# Patient Record
Sex: Male | Born: 1940
Health system: Southern US, Community
[De-identification: ages and names within clinical notes are randomized; demographics above are authoritative.]

## PROBLEM LIST (undated history)

## (undated) DIAGNOSIS — I82409 Acute embolism and thrombosis of unspecified deep veins of unspecified lower extremity: Secondary | ICD-10-CM

## (undated) DIAGNOSIS — I739 Peripheral vascular disease, unspecified: Secondary | ICD-10-CM

## (undated) DIAGNOSIS — I10 Essential (primary) hypertension: Secondary | ICD-10-CM

## (undated) DIAGNOSIS — D649 Anemia, unspecified: Secondary | ICD-10-CM

## (undated) DIAGNOSIS — K219 Gastro-esophageal reflux disease without esophagitis: Secondary | ICD-10-CM

## (undated) DIAGNOSIS — Z972 Presence of dental prosthetic device (complete) (partial): Secondary | ICD-10-CM

## (undated) DIAGNOSIS — E119 Type 2 diabetes mellitus without complications: Secondary | ICD-10-CM

## (undated) HISTORY — PX: APPENDECTOMY: SHX54

## (undated) HISTORY — PX: EYE SURGERY: SHX253

## (undated) HISTORY — PX: OTHER SURGICAL HISTORY: SHX169

## (undated) HISTORY — PX: CAROTID ARTERY ANGIOPLASTY: SHX1300

---

## 2004-08-24 ENCOUNTER — Ambulatory Visit: Payer: Self-pay | Admitting: *Deleted

## 2005-04-12 ENCOUNTER — Ambulatory Visit: Payer: Self-pay

## 2007-05-14 ENCOUNTER — Ambulatory Visit: Payer: Self-pay | Admitting: Internal Medicine

## 2007-06-10 ENCOUNTER — Ambulatory Visit: Payer: Self-pay | Admitting: Gastroenterology

## 2007-06-14 ENCOUNTER — Ambulatory Visit: Payer: Self-pay | Admitting: Internal Medicine

## 2007-07-15 ENCOUNTER — Ambulatory Visit: Payer: Self-pay | Admitting: Internal Medicine

## 2008-08-19 ENCOUNTER — Ambulatory Visit: Payer: Self-pay | Admitting: Internal Medicine

## 2008-09-13 ENCOUNTER — Ambulatory Visit: Payer: Self-pay | Admitting: Internal Medicine

## 2012-01-29 ENCOUNTER — Ambulatory Visit: Payer: Self-pay | Admitting: Gastroenterology

## 2013-02-25 ENCOUNTER — Ambulatory Visit: Payer: Self-pay | Admitting: Physical Medicine and Rehabilitation

## 2013-07-01 ENCOUNTER — Ambulatory Visit: Payer: Self-pay | Admitting: Vascular Surgery

## 2013-08-14 ENCOUNTER — Ambulatory Visit: Payer: Self-pay | Admitting: Vascular Surgery

## 2013-08-14 LAB — BASIC METABOLIC PANEL
Anion Gap: 4 — ABNORMAL LOW (ref 7–16)
BUN: 9 mg/dL (ref 7–18)
Calcium, Total: 9.6 mg/dL (ref 8.5–10.1)
Chloride: 100 mmol/L (ref 98–107)
Co2: 31 mmol/L (ref 21–32)
Creatinine: 0.84 mg/dL (ref 0.60–1.30)
EGFR (African American): >60
EGFR (Non-African Amer.): >60
Glucose: 111 mg/dL — ABNORMAL HIGH (ref 65–99)
Sodium: 135 mmol/L — ABNORMAL LOW (ref 136–145)

## 2013-08-14 LAB — CBC
HCT: 34.1 % — ABNORMAL LOW (ref 40.0–52.0)
HGB: 11.4 g/dL — ABNORMAL LOW (ref 13.0–18.0)
MCH: 28.2 pg (ref 26.0–34.0)
Platelet: 239 10*3/uL (ref 150–440)
WBC: 6.2 10*3/uL (ref 3.8–10.6)

## 2013-08-22 ENCOUNTER — Inpatient Hospital Stay: Payer: Self-pay | Admitting: Vascular Surgery

## 2013-08-23 LAB — CBC WITH DIFFERENTIAL/PLATELET
Basophil #: 0 10*3/uL (ref 0.0–0.1)
Eosinophil #: 0.1 10*3/uL (ref 0.0–0.7)
Eosinophil %: 1.1 %
HCT: 31.1 % — ABNORMAL LOW (ref 40.0–52.0)
HGB: 10.2 g/dL — ABNORMAL LOW (ref 13.0–18.0)
Lymphocyte #: 1.9 10*3/uL (ref 1.0–3.6)
Lymphocyte %: 23.3 %
MCH: 27.9 pg (ref 26.0–34.0)
MCV: 85 fL (ref 80–100)
Monocyte %: 8.1 %
Neutrophil #: 5.5 10*3/uL (ref 1.4–6.5)
Neutrophil %: 67.2 %
RBC: 3.64 10*6/uL — ABNORMAL LOW (ref 4.40–5.90)
RDW: 14.4 % (ref 11.5–14.5)

## 2013-08-23 LAB — PROTIME-INR: INR: 1.1

## 2013-08-23 LAB — BASIC METABOLIC PANEL
Anion Gap: 4 — ABNORMAL LOW (ref 7–16)
BUN: 9 mg/dL (ref 7–18)
Chloride: 104 mmol/L (ref 98–107)
Creatinine: 0.79 mg/dL (ref 0.60–1.30)
Glucose: 133 mg/dL — ABNORMAL HIGH (ref 65–99)
Osmolality: 278 (ref 275–301)
Potassium: 3.7 mmol/L (ref 3.5–5.1)

## 2014-12-06 ENCOUNTER — Emergency Department: Payer: Self-pay | Admitting: Emergency Medicine

## 2015-03-05 NOTE — Op Note (Signed)
PATIENT NAME:  Victor Dixon, Victor Dixon MR#:  [id number] DATE OF BIRTH:  Sep 28, 1941  DATE OF PROCEDURE:  08/22/2013  PREOPERATIVE DIAGNOSIS: Critical stenosis of the right internal carotid artery.   POSTOPERATIVE DIAGNOSIS: Critical stenosis of the right internal carotid artery.   PROCEDURE PERFORMED: Right carotid endarterectomy with primary arterial closure.   SURGEON: Hortencia Pilar, MD   ANESTHESIA: General by endotracheal intubation.  FIRST ASSISTANT: Ms. Gillie Manners.   FLUIDS: Per anesthesia record.   ESTIMATED BLOOD LOSS: 100 mL.   SPECIMEN: Plaque to pathology for permanent section.   INDICATIONS: Victor Dixon is a 74 year old gentleman, who presented to the office with findings of carotid stenosis. Further workup including CT angiography demonstrated a critical stenosis of the right internal carotid artery. The risks and benefits for carotid endarterectomy were reviewed. Alternative therapies were also reviewed. All questions were answered. The patient has agreed to proceed.   DESCRIPTION OF PROCEDURE: The patient is taken to the operating room and placed in the supine position. After adequate general anesthesia is induced and appropriate invasive monitors are placed, he is positioned supine with his neck flexed and rotated to the left. Right neck and chest wall are prepped and draped in a sterile fashion. Appropriate timeout is called.   A curvilinear incision is created along the anterior margin of the sternocleidomastoid muscle and carried down through the soft tissues. Platysma is transected. The patient has venous abnormalities without a clear-cut external jugular, but more of a large venous plexus with 2 large tributaries coursing medial and diagonally across the operative field. The vein coursing diagonally is then dissected down to identify the jugular vein and subsequently ligated and divided. I believe this represents an aberration of the facial vein as facial vein was not  identified crossing over the bifurcation as is usually seen.   The dissection is then carried down to identify the omohyoid and the common carotid identified at that level as the dissection is then carried in a craniad direction along the anterior margin. The bulb is then identified. It is dissected so that a blue Silastic vessel loop is looped around the superior thyroidal, red Silastic vessel loop around the external and internal carotid artery is dissected to a level above visible plaque formation.   Heparin 7000 units are given. The common carotid followed by the internal followed by the external carotid artery are clamped. Arteriotomy is then made, extended with Potts scissors and an indwelling Sundt shunt is placed without difficulty. Flow is then re-established to the brain.   Endarterectomy is then performed under direct visualization for the common internal and bulb area. External carotid artery is treated with eversion technique. A total of 6 interrupted 7-0 Prolene sutures are used to tack the distal intimal edge within the internal carotid artery and five 6-0 Prolene sutures are used to tack the proximal intimal edge within the common carotid artery. Primary closure is then performed using running 6-0 Prolene beginning in the internal and extending it down to the bulb and then running from the common up to join the suture line. Shunt is removed. Flushing maneuvers are performed and the suture line is completed. Flow is then re-established first to the external carotid artery and then the internal carotid artery to prevent distal embolization. The wound is packed with gauze for several minutes and then inspected for hemostasis. Surgicel and Evicel is placed along the bed of the wound and the wound is then closed using 3-0 Vicryl in a running fashion to re-approximate the  platysma and 4-0 Monocryl subcuticular for the skin. Dermabond is applied. The patient tolerated the procedure well. There were no  immediate complications. He will be awakened in the operating room and then taken to the recovery room.    ____________________________ Katha Cabal, MD ggs:aw D: 08/22/2013 10:05:39 ET T: 08/22/2013 10:26:24 ET JOB#: [id number]  cc: Katha Cabal, MD, <Dictator> Christena Flake. Raechel Ache, MD Katha Cabal MD ELECTRONICALLY SIGNED 08/26/2013 17:55

## 2016-08-14 ENCOUNTER — Other Ambulatory Visit: Payer: Self-pay | Admitting: Vascular Surgery

## 2016-08-14 ENCOUNTER — Other Ambulatory Visit
Admission: RE | Admit: 2016-08-14 | Discharge: 2016-08-14 | Disposition: A | Discharge status: Home / Self Care | Payer: Medicare HMO | Source: Ambulatory Visit | Attending: Vascular Surgery | Admitting: Vascular Surgery

## 2016-08-14 DIAGNOSIS — I739 Peripheral vascular disease, unspecified: Secondary | ICD-10-CM | POA: Insufficient documentation

## 2016-08-14 LAB — CREATININE, SERUM
Creatinine, Ser: 0.82 mg/dL (ref 0.61–1.24)
GFR calc non Af Amer: >60 mL/min (ref >60)

## 2016-08-14 LAB — BUN: BUN: 12 mg/dL (ref 6–20)

## 2016-08-15 ENCOUNTER — Encounter
Admission: RE | Disposition: A | Discharge status: Home / Self Care | Payer: Self-pay | Source: Ambulatory Visit | Attending: Vascular Surgery

## 2016-08-15 ENCOUNTER — Ambulatory Visit
Admission: RE | Admit: 2016-08-15 | Discharge: 2016-08-15 | Disposition: A | Discharge status: Home / Self Care | Payer: Medicare HMO | Source: Ambulatory Visit | Attending: Vascular Surgery | Admitting: Vascular Surgery

## 2016-08-15 DIAGNOSIS — E785 Hyperlipidemia, unspecified: Secondary | ICD-10-CM | POA: Insufficient documentation

## 2016-08-15 DIAGNOSIS — I499 Cardiac arrhythmia, unspecified: Secondary | ICD-10-CM | POA: Diagnosis not present

## 2016-08-15 DIAGNOSIS — Z7982 Long term (current) use of aspirin: Secondary | ICD-10-CM | POA: Diagnosis not present

## 2016-08-15 DIAGNOSIS — Z87891 Personal history of nicotine dependence: Secondary | ICD-10-CM | POA: Diagnosis not present

## 2016-08-15 DIAGNOSIS — I6529 Occlusion and stenosis of unspecified carotid artery: Secondary | ICD-10-CM | POA: Diagnosis not present

## 2016-08-15 DIAGNOSIS — I70223 Atherosclerosis of native arteries of extremities with rest pain, bilateral legs: Secondary | ICD-10-CM | POA: Insufficient documentation

## 2016-08-15 DIAGNOSIS — E119 Type 2 diabetes mellitus without complications: Secondary | ICD-10-CM | POA: Insufficient documentation

## 2016-08-15 DIAGNOSIS — E669 Obesity, unspecified: Secondary | ICD-10-CM | POA: Insufficient documentation

## 2016-08-15 DIAGNOSIS — I10 Essential (primary) hypertension: Secondary | ICD-10-CM | POA: Insufficient documentation

## 2016-08-15 DIAGNOSIS — R0989 Other specified symptoms and signs involving the circulatory and respiratory systems: Secondary | ICD-10-CM | POA: Insufficient documentation

## 2016-08-15 DIAGNOSIS — Z79899 Other long term (current) drug therapy: Secondary | ICD-10-CM | POA: Insufficient documentation

## 2016-08-15 HISTORY — PX: PERIPHERAL VASCULAR CATHETERIZATION: SHX172C

## 2016-08-15 HISTORY — DX: Gastro-esophageal reflux disease without esophagitis: K21.9

## 2016-08-15 HISTORY — DX: Anemia, unspecified: D64.9

## 2016-08-15 HISTORY — DX: Type 2 diabetes mellitus without complications: E11.9

## 2016-08-15 HISTORY — DX: Essential (primary) hypertension: I10

## 2016-08-15 LAB — GLUCOSE, CAPILLARY: Glucose-Capillary: 153 mg/dL — ABNORMAL HIGH (ref 65–99)

## 2016-08-15 SURGERY — LOWER EXTREMITY ANGIOGRAPHY
Anesthesia: Moderate Sedation | Laterality: Left

## 2016-08-15 MED ORDER — HYDRALAZINE HCL 20 MG/ML IJ SOLN: 5.0000 mg | INTRAMUSCULAR | Status: DC | PRN

## 2016-08-15 MED ORDER — FENTANYL CITRATE (PF) 100 MCG/2ML IJ SOLN
INTRAMUSCULAR | Status: DC | PRN
  Administered 2016-08-15 (×3): 50 ug via INTRAVENOUS

## 2016-08-15 MED ORDER — HYDROCHLOROTHIAZIDE 25 MG PO TABS
25.0000 mg | ORAL_TABLET | Freq: Once | ORAL | Status: AC
  Administered 2016-08-15: 25 mg via ORAL
  Filled 2016-08-15: qty 1

## 2016-08-15 MED ORDER — LIDOCAINE HCL (PF) 1 % IJ SOLN
INTRAMUSCULAR | Status: AC
  Filled 2016-08-15: qty 5

## 2016-08-15 MED ORDER — OXYCODONE HCL 5 MG PO TABS: 5.0000 mg | ORAL_TABLET | ORAL | Status: DC | PRN

## 2016-08-15 MED ORDER — LOSARTAN POTASSIUM 50 MG PO TABS
100.0000 mg | ORAL_TABLET | Freq: Once | ORAL | Status: AC
  Administered 2016-08-15: 100 mg via ORAL
  Filled 2016-08-15: qty 2

## 2016-08-15 MED ORDER — FENTANYL CITRATE (PF) 100 MCG/2ML IJ SOLN
INTRAMUSCULAR | Status: AC
  Filled 2016-08-15: qty 2

## 2016-08-15 MED ORDER — METHYLPREDNISOLONE SODIUM SUCC 125 MG IJ SOLR
125.0000 mg | INTRAMUSCULAR | Status: DC | PRN
Start: 2016-08-15 — End: 2016-08-15

## 2016-08-15 MED ORDER — ACETAMINOPHEN 325 MG RE SUPP: 325.0000 mg | RECTAL | Status: DC | PRN

## 2016-08-15 MED ORDER — HEPARIN SODIUM (PORCINE) 1000 UNIT/ML IJ SOLN
INTRAMUSCULAR | Status: AC
  Filled 2016-08-15: qty 1

## 2016-08-15 MED ORDER — LABETALOL HCL 5 MG/ML IV SOLN
INTRAVENOUS | Status: AC
Start: 2016-08-15 — End: 2016-08-15
  Administered 2016-08-15: 10 mg via INTRAVENOUS
  Filled 2016-08-15: qty 4

## 2016-08-15 MED ORDER — MIDAZOLAM HCL 2 MG/2ML IJ SOLN
INTRAMUSCULAR | Status: DC | PRN
  Administered 2016-08-15: 2 mg via INTRAVENOUS
  Administered 2016-08-15 (×2): 1 mg via INTRAVENOUS

## 2016-08-15 MED ORDER — CARVEDILOL 6.25 MG PO TABS
6.2500 mg | ORAL_TABLET | Freq: Once | ORAL | Status: AC
  Administered 2016-08-15: 6.25 mg via ORAL
  Filled 2016-08-15: qty 1

## 2016-08-15 MED ORDER — ONDANSETRON HCL 4 MG/2ML IJ SOLN: 4.0000 mg | Freq: Four times a day (QID) | INTRAMUSCULAR | Status: DC | PRN

## 2016-08-15 MED ORDER — LABETALOL HCL 5 MG/ML IV SOLN
10.0000 mg | INTRAVENOUS | Status: DC | PRN
  Administered 2016-08-15 (×3): 10 mg via INTRAVENOUS

## 2016-08-15 MED ORDER — DOCUSATE SODIUM 100 MG PO CAPS: 100.0000 mg | ORAL_CAPSULE | Freq: Every day | ORAL | Status: DC

## 2016-08-15 MED ORDER — FAMOTIDINE 20 MG PO TABS: 40.0000 mg | ORAL_TABLET | ORAL | Status: DC | PRN

## 2016-08-15 MED ORDER — PANTOPRAZOLE SODIUM 40 MG PO TBEC: 40.0000 mg | DELAYED_RELEASE_TABLET | Freq: Every day | ORAL | Status: DC

## 2016-08-15 MED ORDER — MORPHINE SULFATE (PF) 4 MG/ML IV SOLN: 2.0000 mg | INTRAVENOUS | Status: DC | PRN

## 2016-08-15 MED ORDER — SODIUM CHLORIDE 0.9 % IV SOLN: INTRAVENOUS | Status: DC

## 2016-08-15 MED ORDER — DEXTROSE 5 % IV SOLN
INTRAVENOUS | Status: AC
  Filled 2016-08-15 (×24): qty 1.5

## 2016-08-15 MED ORDER — METOPROLOL TARTRATE 5 MG/5ML IV SOLN: 5.0000 mg | Freq: Four times a day (QID) | INTRAVENOUS | Status: DC

## 2016-08-15 MED ORDER — DEXTROSE 5 % IV SOLN: 1.5000 g | INTRAVENOUS | Status: DC

## 2016-08-15 MED ORDER — HYDROMORPHONE HCL 1 MG/ML IJ SOLN: 1.0000 mg | Freq: Once | INTRAMUSCULAR | Status: DC

## 2016-08-15 MED ORDER — ACETAMINOPHEN 325 MG PO TABS: 325.0000 mg | ORAL_TABLET | ORAL | Status: DC | PRN

## 2016-08-15 MED ORDER — IOPAMIDOL (ISOVUE-300) INJECTION 61%
INTRAVENOUS | Status: DC | PRN
  Administered 2016-08-15: 80 mL via INTRAVENOUS

## 2016-08-15 MED ORDER — ALUM & MAG HYDROXIDE-SIMETH 200-200-20 MG/5ML PO SUSP: 15.0000 mL | ORAL | Status: DC | PRN

## 2016-08-15 MED ORDER — MIDAZOLAM HCL 5 MG/5ML IJ SOLN
INTRAMUSCULAR | Status: AC
  Filled 2016-08-15: qty 5

## 2016-08-15 MED ORDER — LABETALOL HCL 5 MG/ML IV SOLN
INTRAVENOUS | Status: AC
  Filled 2016-08-15: qty 4

## 2016-08-15 SURGICAL SUPPLY — 10 items
CATH PIG 70CM (CATHETERS) ×4 IMPLANT
DEVICE STARCLOSE SE CLOSURE (Vascular Products) ×4 IMPLANT
DEVICE TORQUE (MISCELLANEOUS) ×4 IMPLANT
GLIDEWIRE ANGLED SS 035X260CM (WIRE) ×4 IMPLANT
PACK ANGIOGRAPHY (CUSTOM PROCEDURE TRAY) ×4 IMPLANT
SET INTRO CAPELLA COAXIAL (SET/KITS/TRAYS/PACK) ×4 IMPLANT
SHEATH BRITE TIP 5FRX11 (SHEATH) ×4 IMPLANT
SYR MEDRAD MARK V 150ML (SYRINGE) ×4 IMPLANT
TUBING CONTRAST HIGH PRESS 72 (TUBING) ×4 IMPLANT
WIRE J 3MM .035X145CM (WIRE) ×4 IMPLANT

## 2016-08-15 NOTE — Op Note (Signed)
[facility]  Percutaneous Study/Intervention Procedural Note   Date of Surgery: 08/15/2016,8:57 AM  Surgeon:Schnier, Dolores Lory   Pre-operative Diagnosis: Atherosclerotic occlusive disease bilateral lower extremities with lifestyle limiting claudication and mild rest pain symptoms  Post-operative diagnosis:  Same  Procedure(s) Performed:  1.  Abdominal aortogram  2.  Right lower extremity distal runoff third order catheter placement   Anesthesia: Conscious sedation was administered under my direct supervision. IV Versed plus fentanyl were utilized. Continuous ECG, pulse oximetry and blood pressure was monitored throughout the entire procedure.  Conscious sedation was administered for a total of 30 minutes.  Sheath: 5 French sheath retrograde left common femoral artery  Contrast: 80 cc   Fluoroscopy Time: 2.3 minutes  Indications:  The patient presented to the office for routine follow-up and surveillance of his atherosclerotic occlusive disease. He had increasing complaints with respect to his lower extremities. He voiced lifestyle limitations and described some mild rest pain symptoms. Noninvasive studies demonstrated moderate to severe atherosclerotic occlusive disease. There were some benefits for angiography with possible intervention are reviewed all questions answered patient agrees to proceed  Procedure:  Kaenan Trollingeris a 75 y.o. male who was identified and appropriate procedural time out was performed.  The patient was then placed supine on the table and prepped and draped in the usual sterile fashion.  Ultrasound was used to evaluate the left common femoral artery.  It was patent .  A digital ultrasound image was acquired.  Amicropuncture needle was used to access the left common femoral artery under direct ultrasound guidance and a permanent image wassaved for the record.microwire was then advanced under fluoroscopic guidance followed by micro-sheath.   A 0.035 J wire was advanced without resistance and a 5Fr sheath was placed.    Pigtail catheter was then advanced to the level of T12 and AP projection of the aorta was obtained. Pigtail catheter was then repositioned to above the bifurcation and LAO view of the pelvis was obtained. Stiff angled Glidewire and pigtail catheter was then used across the bifurcation and the catheter was positioned in the distal external iliac artery.  RAO of the right groin was then obtained. Wire was reintroduced and negotiated into the profunda and the catheter was advanced into the profunda. Distal runoff was then performed.  After review of the images the catheter was removed over wire and an LAO view of the groin was obtained. StarClose device was deployed without difficulty but did not achieve hemostasis. Therefore manual pressure was held.  Findings:   Aortogram:  Diffuse atherosclerotic disease but no hemodynamically significant lesions noted. There are bilateral single renal arteries no hemodynamically significant renal artery stenosis. The common iliac arteries are patent bilaterally however, on the right there is a shelflike plaque in the proximal one third with some poststenotic dilatation noted nevertheless it does not appear to be hemodynamically significant. On the left the common iliac is widely patent. Bilateral external iliac arteries are widely patent. The internal iliac arteries appear to be occluded bilaterally  Right Lower Extremity:  Common femoral demonstrates moderate to severe disease. There is a well collateralized deep femoral. The SFA appears to be patent in its proximal 2-3 cm but then occludes. The SFA and popliteal are occluded throughout their course down to below the tibial plateau where a short segment of popliteal is reconstituted and fills an anterior tibial which occludes for short distance. The anterior tibial is reconstituted in its proximal one third and then is patent down to  the ankle  where the true dorsalis pedis appears to be occluded but there is a large collateral which intermittently fills the pedal arch. The tibioperoneal trunk and posterior tibial and peroneal are completely occluded throughout their entire course there nonvisualized throughout their entire course.  Left Lower Extremity:  The common femoral is patent in its proximal one third but then tapers to an occlusion at the level of the profunda femoris. Profunda femoris is heavily diseased. SFA is occluded at its origin.  SUMMARY: This patient has extensive atherosclerotic occlusive disease essentially on the right he is occluded from the origin of the SFA down to the mid anterior tibial. He has single vessel runoff. And this single vessel runoff does not cross the ankle nor fill and intact pedal arch. Given these findings his best option for treatment would be a femoral to anterior tibial bypass with vein. However given the fact that he is experiencing only mild rest pain and short distance claudication I will discuss whether he wishes to proceed with surgery in the office as it might be more prudent to wait until the severity of his symptoms increase i.e. he has truly debilitating rest pain or an ulceration develops.   Disposition: Patient was taken to the recovery room in stable condition having tolerated the procedure well.  Belenda Cruise Schnier 08/15/2016,8:57 AM

## 2016-08-15 NOTE — H&P (Signed)
[facility] History & Physical Update  The patient was interviewed and re-examined.  The patient's previous History and Physical has been reviewed and is unchanged.  There is no change in the plan of care. We plan to proceed with the scheduled procedure.  Hortencia Pilar, MD  08/15/2016, 7:57 AM

## 2016-08-15 NOTE — Progress Notes (Signed)
Patient states that he did not take BP medications this am, BP remains elevated after PRN medication x 3. Dr Delana Meyer notified and ordered to give patient's home medications. Orders placed.

## 2016-08-16 ENCOUNTER — Encounter: Payer: Self-pay | Admitting: Vascular Surgery

## 2016-09-11 ENCOUNTER — Encounter (INDEPENDENT_AMBULATORY_CARE_PROVIDER_SITE_OTHER): Payer: Self-pay

## 2016-09-11 ENCOUNTER — Ambulatory Visit (INDEPENDENT_AMBULATORY_CARE_PROVIDER_SITE_OTHER): Payer: Self-pay | Admitting: Vascular Surgery

## 2016-10-17 ENCOUNTER — Other Ambulatory Visit (INDEPENDENT_AMBULATORY_CARE_PROVIDER_SITE_OTHER): Payer: Self-pay | Admitting: Vascular Surgery

## 2016-10-17 DIAGNOSIS — N184 Chronic kidney disease, stage 4 (severe): Secondary | ICD-10-CM

## 2016-10-17 DIAGNOSIS — N189 Chronic kidney disease, unspecified: Secondary | ICD-10-CM | POA: Insufficient documentation

## 2016-10-23 ENCOUNTER — Encounter (INDEPENDENT_AMBULATORY_CARE_PROVIDER_SITE_OTHER): Payer: Self-pay

## 2016-10-23 ENCOUNTER — Ambulatory Visit (INDEPENDENT_AMBULATORY_CARE_PROVIDER_SITE_OTHER): Payer: Self-pay | Admitting: Vascular Surgery

## 2016-11-10 ENCOUNTER — Other Ambulatory Visit: Payer: Self-pay | Admitting: Neurosurgery

## 2016-11-10 DIAGNOSIS — M419 Scoliosis, unspecified: Secondary | ICD-10-CM

## 2016-11-22 ENCOUNTER — Other Ambulatory Visit: Payer: Medicare HMO

## 2016-11-22 ENCOUNTER — Ambulatory Visit
Admission: RE | Admit: 2016-11-22 | Discharge: 2016-11-22 | Disposition: A | Discharge status: Home / Self Care | Payer: Medicare HMO | Source: Ambulatory Visit | Attending: Neurosurgery | Admitting: Neurosurgery

## 2016-11-22 DIAGNOSIS — M50223 Other cervical disc displacement at C6-C7 level: Secondary | ICD-10-CM | POA: Diagnosis not present

## 2016-11-22 DIAGNOSIS — M5124 Other intervertebral disc displacement, thoracic region: Secondary | ICD-10-CM | POA: Insufficient documentation

## 2016-11-22 DIAGNOSIS — M419 Scoliosis, unspecified: Secondary | ICD-10-CM | POA: Insufficient documentation

## 2016-11-22 DIAGNOSIS — M47816 Spondylosis without myelopathy or radiculopathy, lumbar region: Secondary | ICD-10-CM | POA: Diagnosis not present

## 2016-12-28 ENCOUNTER — Ambulatory Visit (INDEPENDENT_AMBULATORY_CARE_PROVIDER_SITE_OTHER): Payer: Medicare HMO

## 2016-12-28 ENCOUNTER — Ambulatory Visit (INDEPENDENT_AMBULATORY_CARE_PROVIDER_SITE_OTHER): Payer: Medicare HMO | Admitting: Vascular Surgery

## 2016-12-28 ENCOUNTER — Encounter (INDEPENDENT_AMBULATORY_CARE_PROVIDER_SITE_OTHER): Payer: Self-pay | Admitting: Vascular Surgery

## 2016-12-28 ENCOUNTER — Other Ambulatory Visit (INDEPENDENT_AMBULATORY_CARE_PROVIDER_SITE_OTHER): Payer: Self-pay | Admitting: Vascular Surgery

## 2016-12-28 DIAGNOSIS — K219 Gastro-esophageal reflux disease without esophagitis: Secondary | ICD-10-CM | POA: Insufficient documentation

## 2016-12-28 DIAGNOSIS — E782 Mixed hyperlipidemia: Secondary | ICD-10-CM

## 2016-12-28 DIAGNOSIS — E785 Hyperlipidemia, unspecified: Secondary | ICD-10-CM | POA: Insufficient documentation

## 2016-12-28 DIAGNOSIS — Z01818 Encounter for other preprocedural examination: Secondary | ICD-10-CM

## 2016-12-28 DIAGNOSIS — I70213 Atherosclerosis of native arteries of extremities with intermittent claudication, bilateral legs: Secondary | ICD-10-CM

## 2016-12-28 DIAGNOSIS — I10 Essential (primary) hypertension: Secondary | ICD-10-CM

## 2016-12-28 DIAGNOSIS — E119 Type 2 diabetes mellitus without complications: Secondary | ICD-10-CM | POA: Insufficient documentation

## 2016-12-28 DIAGNOSIS — I70219 Atherosclerosis of native arteries of extremities with intermittent claudication, unspecified extremity: Secondary | ICD-10-CM | POA: Insufficient documentation

## 2016-12-28 DIAGNOSIS — E1140 Type 2 diabetes mellitus with diabetic neuropathy, unspecified: Secondary | ICD-10-CM

## 2016-12-28 NOTE — Progress Notes (Signed)
MRN : [id number]  Victor Dixon is a 76 y.o. (1941/01/09) male who presents with chief complaint of  Chief Complaint  Patient presents with  . Re-evaluation    Vein mapping-LE  .  History of Present Illness: The patient returns to the office for followup and review of the noninvasive studies. There have been no interval changes in lower extremity symptoms. No interval shortening of the patient's claudication distance or development of rest pain symptoms. No new ulcers or wounds have occurred since the last visit.  There have been no significant changes to the patient's overall health care.  The patient denies amaurosis fugax or recent TIA symptoms. There are no recent neurological changes noted. The patient denies history of DVT, PE or superficial thrombophlebitis. The patient denies recent episodes of angina or shortness of breath.    Current Meds  Medication Sig  . aspirin EC 81 MG tablet Take 81 mg by mouth daily.  . carvedilol (COREG) 6.25 MG tablet Take 6.25 mg by mouth 2 (two) times daily with a meal.  . cilostazol (PLETAL) 100 MG tablet Take 100 mg by mouth 2 (two) times daily.  . Cyanocobalamin (VITAMIN B 12) 250 MCG LOZG Take 500 mcg by mouth every morning.  . ferrous sulfate 325 (65 FE) MG tablet Take 325 mg by mouth daily with breakfast.  . ferrous sulfate 325 (65 FE) MG tablet Take by mouth.  Marland Kitchen FIFTY50 SAFETY SEAL LANCETS MISC   . finasteride (PROSCAR) 5 MG tablet Take 5 mg by mouth daily.  Marland Kitchen gabapentin (NEURONTIN) 300 MG capsule Take 300 mg by mouth 3 (three) times daily.  Marland Kitchen glimepiride (AMARYL) 2 MG tablet Take 2 mg by mouth daily with breakfast.  . losartan-hydrochlorothiazide (HYZAAR) 100-25 MG tablet Take 1 tablet by mouth daily.  . metFORMIN (GLUCOPHAGE-XR) 500 MG 24 hr tablet Take 500 mg by mouth 2 (two) times daily with a meal.  . pantoprazole (PROTONIX) 40 MG tablet Take 40 mg by mouth daily.  . sennosides-docusate sodium (SENOKOT-S) 8.6-50 MG tablet Take  1 tablet by mouth daily.  . simvastatin (ZOCOR) 40 MG tablet Take 40 mg by mouth daily.    Past Medical History:  Diagnosis Date  . Anemia   . Diabetes mellitus without complication ([place])   . GERD (gastroesophageal reflux disease)   . Hypertension     Past Surgical History:  Procedure Laterality Date  . APPENDECTOMY    . CAROTID ARTERY ANGIOPLASTY Right   . PERIPHERAL VASCULAR CATHETERIZATION Left 08/15/2016   Procedure: Lower Extremity Angiography;  Surgeon: Katha Cabal, MD;  Location: [facility];  Service: Cardiovascular;  Laterality: Left;  . PERIPHERAL VASCULAR CATHETERIZATION  08/15/2016   Procedure: Lower Extremity Intervention;  Surgeon: Katha Cabal, MD;  Location: [facility];  Service: Cardiovascular;;    Social History Social History  Substance Use Topics  . Smoking status: Former Research scientist (life sciences)  . Smokeless tobacco: Never Used  . Alcohol use No    Family History No family history on file. No family history of bleeding/clotting disorders, porphyria or autoimmune disease   No Known Allergies   REVIEW OF SYSTEMS (Negative unless checked)  Constitutional: [] Weight loss  [] Fever  [] Chills Cardiac: [] Chest pain   [] Chest pressure   [] Palpitations   [] Shortness of breath when laying flat   [] Shortness of breath with exertion. Vascular:  [x] Pain in legs with walking   [] Pain in legs at rest  [] History of DVT   [] Phlebitis   [  x]Swelling in legs   [] Varicose veins   [] Non-healing ulcers Pulmonary:   [] Uses home oxygen   [] Productive cough   [] Hemoptysis   [] Wheeze  [] COPD   [] Asthma Neurologic:  [] Dizziness   [] Seizures   [] History of stroke   [] History of TIA  [] Aphasia   [] Vissual changes   [] Weakness or numbness in arm   [] Weakness or numbness in leg Musculoskeletal:   [] Joint swelling   [x] Joint pain   [x] Low back pain Hematologic:  [] Easy bruising  [] Easy bleeding   [] Hypercoagulable state   [] Anemic Gastrointestinal:  [] Diarrhea   [] Vomiting   [] Gastroesophageal reflux/heartburn   [] Difficulty swallowing. Genitourinary:  [] Chronic kidney disease   [] Difficult urination  [] Frequent urination   [] Blood in urine Skin:  [] Rashes   [] Ulcers  Psychological:  [] History of anxiety   []  History of major depression.  Physical Examination  Vitals:   12/28/16 1520  BP: (!) 129/59  Pulse: (!) 102  Resp: 16  Weight: 212 lb (96.2 kg)  Height: 5\' 11"  (1.803 m)   Body mass index is 29.57 kg/m. Gen: WD/WN, NAD Head: /AT, No temporalis wasting.  Ear/Nose/Throat: Hearing grossly intact, nares w/o erythema or drainage, poor dentition Eyes: PER, EOMI, sclera nonicteric.  Neck: Supple, no masses.  No bruit or JVD.  Pulmonary:  Good air movement, clear to auscultation bilaterally, no use of accessory muscles.  Cardiac: RRR, normal S1, S2, no Murmurs. Vascular:  2-3 second cap refill non ulcers feet cool not cold to tough, 2+ pitting edema mild venous stasis changes Vessel Right Left  Radial Palpable Palpable  Ulnar Palpable Palpable  Brachial Palpable Palpable  Carotid Palpable Palpable  Femoral Palpable Palpable  Popliteal Not Palpable Not Palpable  PT Not Palpable Not Palpable  DP Not Palpable Not Palpable   Gastrointestinal: soft, non-distended. No guarding/no peritoneal signs.  Musculoskeletal: M/S 5/5 throughout.  No deformity or atrophy.  Neurologic: CN 2-12 intact. Pain and light touch intact in extremities.  Symmetrical.  Speech is fluent. Motor exam as listed above. Psychiatric: Judgment intact, Mood & affect appropriate for pt's clinical situation. Dermatologic: No rashes or ulcers noted.  No changes consistent with cellulitis. Lymph : No Cervical lymphadenopathy, no lichenification or skin changes of chronic lymphedema.  CBC Lab Results  Component Value Date   WBC 8.2 08/23/2013   HGB 10.2 (L) 08/23/2013   HCT 31.1 (L) 08/23/2013   MCV 85 08/23/2013   PLT 204 08/23/2013    BMET    Component Value Date/Time   NA  139 08/23/2013 0231   K 3.7 08/23/2013 0231   CL 104 08/23/2013 0231   CO2 31 08/23/2013 0231   GLUCOSE 133 (H) 08/23/2013 0231   BUN 12 08/14/2016 1235   BUN 9 08/23/2013 0231   CREATININE 0.82 08/14/2016 1235   CREATININE 0.79 08/23/2013 0231   CALCIUM 8.5 08/23/2013 0231   GFRNONAA >60 08/14/2016 1235   GFRNONAA >60 08/23/2013 0231   GFRAA >60 08/14/2016 1235   GFRAA >60 08/23/2013 0231   CrCl cannot be calculated (Patient's most recent lab result is older than the maximum 21 days allowed.).  COAG Lab Results  Component Value Date   INR 1.1 08/23/2013    Radiology No results found.   Assessment/Plan 1. Atherosclerosis of native artery of both lower extremities with intermittent claudication (HCC)  Recommend:  The patient has evidence of atherosclerosis of the lower extremities with claudication.  The patient does not voice lifestyle limiting changes at this point in time.  Noninvasive studies do not suggest clinically significant change.  No invasive studies, angiography or surgery at this time The patient should continue walking and begin a more formal exercise program.  The patient should continue antiplatelet therapy and aggressive treatment of the lipid abnormalities  No changes in the patient's medications at this time  The patient should continue wearing graduated compression socks 10-15 mmHg strength to control the mild edema.   2. Essential hypertension Continue antihypertensive medications as already ordered, these medications have been reviewed and there are no changes at this time.  3. Mixed hyperlipidemia Continue statin as ordered and reviewed, no changes at this time  4. Type 2 diabetes mellitus with diabetic neuropathy, without long-term current use of insulin (HCC) Continue hypoglycemic medications as already ordered, these medications have been reviewed and there are no changes at this time.  Hgb A1C to be monitored as already arranged by  primary service  5. Gastroesophageal reflux disease without esophagitis Continue PPI as already ordered, these medications have been reviewed and there are no changes at this time.   Hortencia Pilar, MD  12/28/2016 4:16 PM

## 2016-12-31 ENCOUNTER — Emergency Department: Payer: Medicare HMO

## 2016-12-31 ENCOUNTER — Encounter: Payer: Self-pay | Admitting: Emergency Medicine

## 2016-12-31 ENCOUNTER — Inpatient Hospital Stay
Admission: EM | Admit: 2016-12-31 | Discharge: 2017-01-02 | DRG: 871 | Disposition: A | Discharge status: Home / Self Care | Payer: Medicare HMO | Attending: Internal Medicine | Admitting: Internal Medicine

## 2016-12-31 DIAGNOSIS — J9601 Acute respiratory failure with hypoxia: Secondary | ICD-10-CM | POA: Diagnosis present

## 2016-12-31 DIAGNOSIS — E871 Hypo-osmolality and hyponatremia: Secondary | ICD-10-CM | POA: Diagnosis present

## 2016-12-31 DIAGNOSIS — Z86718 Personal history of other venous thrombosis and embolism: Secondary | ICD-10-CM | POA: Diagnosis not present

## 2016-12-31 DIAGNOSIS — K219 Gastro-esophageal reflux disease without esophagitis: Secondary | ICD-10-CM | POA: Diagnosis present

## 2016-12-31 DIAGNOSIS — Z7984 Long term (current) use of oral hypoglycemic drugs: Secondary | ICD-10-CM

## 2016-12-31 DIAGNOSIS — J1000 Influenza due to other identified influenza virus with unspecified type of pneumonia: Secondary | ICD-10-CM | POA: Diagnosis present

## 2016-12-31 DIAGNOSIS — E860 Dehydration: Secondary | ICD-10-CM | POA: Diagnosis present

## 2016-12-31 DIAGNOSIS — J189 Pneumonia, unspecified organism: Secondary | ICD-10-CM | POA: Diagnosis present

## 2016-12-31 DIAGNOSIS — E1165 Type 2 diabetes mellitus with hyperglycemia: Secondary | ICD-10-CM | POA: Diagnosis present

## 2016-12-31 DIAGNOSIS — D638 Anemia in other chronic diseases classified elsewhere: Secondary | ICD-10-CM | POA: Diagnosis present

## 2016-12-31 DIAGNOSIS — Z79899 Other long term (current) drug therapy: Secondary | ICD-10-CM | POA: Diagnosis not present

## 2016-12-31 DIAGNOSIS — J40 Bronchitis, not specified as acute or chronic: Secondary | ICD-10-CM | POA: Diagnosis present

## 2016-12-31 DIAGNOSIS — Z7982 Long term (current) use of aspirin: Secondary | ICD-10-CM | POA: Diagnosis not present

## 2016-12-31 DIAGNOSIS — J101 Influenza due to other identified influenza virus with other respiratory manifestations: Secondary | ICD-10-CM

## 2016-12-31 DIAGNOSIS — E1151 Type 2 diabetes mellitus with diabetic peripheral angiopathy without gangrene: Secondary | ICD-10-CM | POA: Diagnosis present

## 2016-12-31 DIAGNOSIS — R42 Dizziness and giddiness: Secondary | ICD-10-CM | POA: Diagnosis present

## 2016-12-31 DIAGNOSIS — I10 Essential (primary) hypertension: Secondary | ICD-10-CM | POA: Diagnosis present

## 2016-12-31 DIAGNOSIS — E785 Hyperlipidemia, unspecified: Secondary | ICD-10-CM | POA: Diagnosis present

## 2016-12-31 DIAGNOSIS — Z87891 Personal history of nicotine dependence: Secondary | ICD-10-CM

## 2016-12-31 DIAGNOSIS — A419 Sepsis, unspecified organism: Secondary | ICD-10-CM

## 2016-12-31 DIAGNOSIS — R0902 Hypoxemia: Secondary | ICD-10-CM

## 2016-12-31 DIAGNOSIS — N400 Enlarged prostate without lower urinary tract symptoms: Secondary | ICD-10-CM | POA: Diagnosis present

## 2016-12-31 LAB — URINALYSIS, COMPLETE (UACMP) WITH MICROSCOPIC
BACTERIA UA: NONE SEEN
Bilirubin Urine: NEGATIVE
GLUCOSE, UA: 50 mg/dL — AB
KETONES UR: NEGATIVE mg/dL
Leukocytes, UA: NEGATIVE
Nitrite: NEGATIVE
Specific Gravity, Urine: 1.023 (ref 1.005–1.030)
pH: 5 (ref 5.0–8.0)

## 2016-12-31 LAB — CBC WITH DIFFERENTIAL/PLATELET
Basophils Absolute: 0 10*3/uL (ref 0–0.1)
Basophils Relative: 0 %
Eosinophils Absolute: 0 10*3/uL (ref 0–0.7)
Eosinophils Relative: 0 %
HEMATOCRIT: 25.2 % — AB (ref 40.0–52.0)
Hemoglobin: 8.2 g/dL — ABNORMAL LOW (ref 13.0–18.0)
Lymphocytes Relative: 2 %
Lymphs Abs: 0.2 10*3/uL — ABNORMAL LOW (ref 1.0–3.6)
MCH: 26.3 pg (ref 26.0–34.0)
MCHC: 32.4 g/dL (ref 32.0–36.0)
MCV: 81.1 fL (ref 80.0–100.0)
MONO ABS: 0.3 10*3/uL (ref 0.2–1.0)
Monocytes Relative: 4 %
NEUTROS ABS: 7.1 10*3/uL — AB (ref 1.4–6.5)
Neutrophils Relative %: 94 %
Platelets: 250 10*3/uL (ref 150–440)
RBC: 3.11 MIL/uL — ABNORMAL LOW (ref 4.40–5.90)
RDW: 14.8 % — AB (ref 11.5–14.5)
WBC: 7.6 10*3/uL (ref 3.8–10.6)

## 2016-12-31 LAB — LACTIC ACID, PLASMA
LACTIC ACID, VENOUS: 2.3 mmol/L — AB (ref 0.5–1.9)
Lactic Acid, Venous: 1.4 mmol/L (ref 0.5–1.9)

## 2016-12-31 LAB — COMPREHENSIVE METABOLIC PANEL
ALK PHOS: 49 U/L (ref 38–126)
ALT: 28 U/L (ref 17–63)
AST: 46 U/L — AB (ref 15–41)
Albumin: 2.9 g/dL — ABNORMAL LOW (ref 3.5–5.0)
Anion gap: 8 (ref 5–15)
BILIRUBIN TOTAL: 0.9 mg/dL (ref 0.3–1.2)
BUN: 29 mg/dL — AB (ref 6–20)
CALCIUM: 8.3 mg/dL — AB (ref 8.9–10.3)
CO2: 27 mmol/L (ref 22–32)
Chloride: 93 mmol/L — ABNORMAL LOW (ref 101–111)
Creatinine, Ser: 0.94 mg/dL (ref 0.61–1.24)
GFR calc Af Amer: >60 mL/min (ref >60)
Glucose, Bld: 282 mg/dL — ABNORMAL HIGH (ref 65–99)
POTASSIUM: 3.8 mmol/L (ref 3.5–5.1)
Sodium: 128 mmol/L — ABNORMAL LOW (ref 135–145)
TOTAL PROTEIN: 6.7 g/dL (ref 6.5–8.1)

## 2016-12-31 LAB — INFLUENZA PANEL BY PCR (TYPE A & B)
Influenza A By PCR: POSITIVE — AB
Influenza B By PCR: NEGATIVE

## 2016-12-31 LAB — PROCALCITONIN: PROCALCITONIN: 3.71 ng/mL

## 2016-12-31 LAB — GLUCOSE, CAPILLARY: Glucose-Capillary: 305 mg/dL — ABNORMAL HIGH (ref 65–99)

## 2016-12-31 MED ORDER — GLIMEPIRIDE 2 MG PO TABS
2.0000 mg | ORAL_TABLET | Freq: Every day | ORAL | Status: DC
Start: 2017-01-01 — End: 2016-12-31

## 2016-12-31 MED ORDER — IPRATROPIUM-ALBUTEROL 0.5-2.5 (3) MG/3ML IN SOLN
3.0000 mL | Freq: Four times a day (QID) | RESPIRATORY_TRACT | Status: DC
  Administered 2016-12-31 – 2017-01-01 (×2): 3 mL via RESPIRATORY_TRACT
  Filled 2016-12-31 (×2): qty 3

## 2016-12-31 MED ORDER — METFORMIN HCL ER 500 MG PO TB24: 500.0000 mg | ORAL_TABLET | Freq: Two times a day (BID) | ORAL | Status: DC

## 2016-12-31 MED ORDER — ENOXAPARIN SODIUM 40 MG/0.4ML ~~LOC~~ SOLN
40.0000 mg | SUBCUTANEOUS | Status: DC
  Administered 2016-12-31 – 2017-01-01 (×2): 40 mg via SUBCUTANEOUS
  Filled 2016-12-31 (×2): qty 0.4

## 2016-12-31 MED ORDER — PANTOPRAZOLE SODIUM 40 MG PO TBEC
40.0000 mg | DELAYED_RELEASE_TABLET | Freq: Every day | ORAL | Status: DC
  Administered 2017-01-01 – 2017-01-02 (×2): 40 mg via ORAL
  Filled 2016-12-31 (×2): qty 1

## 2016-12-31 MED ORDER — ACETAMINOPHEN 325 MG PO TABS: 650.0000 mg | ORAL_TABLET | Freq: Four times a day (QID) | ORAL | Status: DC | PRN

## 2016-12-31 MED ORDER — CYANOCOBALAMIN 500 MCG PO TABS
500.0000 ug | ORAL_TABLET | Freq: Every morning | ORAL | Status: DC
Start: 2017-01-01 — End: 2017-01-02
  Administered 2017-01-01 – 2017-01-02 (×2): 500 ug via ORAL
  Filled 2016-12-31 (×2): qty 1

## 2016-12-31 MED ORDER — SENNOSIDES-DOCUSATE SODIUM 8.6-50 MG PO TABS
1.0000 | ORAL_TABLET | Freq: Every day | ORAL | Status: DC
  Administered 2017-01-01 – 2017-01-02 (×2): 1 via ORAL
  Filled 2016-12-31 (×2): qty 1

## 2016-12-31 MED ORDER — ONDANSETRON HCL 4 MG/2ML IJ SOLN: 4.0000 mg | Freq: Four times a day (QID) | INTRAMUSCULAR | Status: DC | PRN

## 2016-12-31 MED ORDER — OSELTAMIVIR PHOSPHATE 75 MG PO CAPS
75.0000 mg | ORAL_CAPSULE | Freq: Once | ORAL | Status: AC
Start: 2016-12-31 — End: 2016-12-31
  Administered 2016-12-31: 75 mg via ORAL
  Filled 2016-12-31: qty 1

## 2016-12-31 MED ORDER — PIPERACILLIN-TAZOBACTAM 3.375 G IVPB
3.3750 g | Freq: Three times a day (TID) | INTRAVENOUS | Status: DC
Start: 2016-12-31 — End: 2017-01-01
  Administered 2016-12-31 – 2017-01-01 (×2): 3.375 g via INTRAVENOUS
  Filled 2016-12-31 (×2): qty 50

## 2016-12-31 MED ORDER — FINASTERIDE 5 MG PO TABS
5.0000 mg | ORAL_TABLET | Freq: Every day | ORAL | Status: DC
  Administered 2017-01-01 – 2017-01-02 (×2): 5 mg via ORAL
  Filled 2016-12-31 (×2): qty 1

## 2016-12-31 MED ORDER — VANCOMYCIN HCL IN DEXTROSE 1-5 GM/200ML-% IV SOLN
1000.0000 mg | Freq: Once | INTRAVENOUS | Status: AC
  Administered 2016-12-31: 1000 mg via INTRAVENOUS
  Filled 2016-12-31: qty 200

## 2016-12-31 MED ORDER — CARVEDILOL 6.25 MG PO TABS
6.2500 mg | ORAL_TABLET | Freq: Two times a day (BID) | ORAL | Status: DC
  Administered 2017-01-01 – 2017-01-02 (×3): 6.25 mg via ORAL
  Filled 2016-12-31 (×3): qty 1

## 2016-12-31 MED ORDER — FERROUS SULFATE 325 (65 FE) MG PO TABS
325.0000 mg | ORAL_TABLET | Freq: Every day | ORAL | Status: DC
  Administered 2017-01-01 – 2017-01-02 (×2): 325 mg via ORAL
  Filled 2016-12-31 (×2): qty 1

## 2016-12-31 MED ORDER — PIPERACILLIN-TAZOBACTAM 3.375 G IVPB 30 MIN
3.3750 g | Freq: Once | INTRAVENOUS | Status: AC
  Administered 2016-12-31: 3.375 g via INTRAVENOUS
  Filled 2016-12-31: qty 50

## 2016-12-31 MED ORDER — ACETAMINOPHEN 650 MG RE SUPP: 650.0000 mg | Freq: Four times a day (QID) | RECTAL | Status: DC | PRN

## 2016-12-31 MED ORDER — ONDANSETRON HCL 4 MG PO TABS: 4.0000 mg | ORAL_TABLET | Freq: Four times a day (QID) | ORAL | Status: DC | PRN

## 2016-12-31 MED ORDER — SODIUM CHLORIDE 0.9 % IV SOLN
INTRAVENOUS | Status: DC
  Administered 2016-12-31: 23:00:00 via INTRAVENOUS

## 2016-12-31 MED ORDER — CILOSTAZOL 100 MG PO TABS
100.0000 mg | ORAL_TABLET | Freq: Two times a day (BID) | ORAL | Status: DC
  Administered 2016-12-31 – 2017-01-02 (×4): 100 mg via ORAL
  Filled 2016-12-31 (×6): qty 1

## 2016-12-31 MED ORDER — BISACODYL 5 MG PO TBEC: 5.0000 mg | DELAYED_RELEASE_TABLET | Freq: Every day | ORAL | Status: DC | PRN

## 2016-12-31 MED ORDER — GABAPENTIN 300 MG PO CAPS
300.0000 mg | ORAL_CAPSULE | Freq: Three times a day (TID) | ORAL | Status: DC
  Administered 2016-12-31 – 2017-01-02 (×5): 300 mg via ORAL
  Filled 2016-12-31 (×5): qty 1

## 2016-12-31 MED ORDER — DOCUSATE SODIUM 100 MG PO CAPS
100.0000 mg | ORAL_CAPSULE | Freq: Two times a day (BID) | ORAL | Status: DC
  Administered 2016-12-31 – 2017-01-02 (×4): 100 mg via ORAL
  Filled 2016-12-31 (×4): qty 1

## 2016-12-31 MED ORDER — VANCOMYCIN HCL 10 G IV SOLR
1500.0000 mg | Freq: Two times a day (BID) | INTRAVENOUS | Status: DC
  Administered 2017-01-01: 1500 mg via INTRAVENOUS
  Filled 2016-12-31 (×2): qty 1500

## 2016-12-31 MED ORDER — TRAZODONE HCL 50 MG PO TABS
25.0000 mg | ORAL_TABLET | Freq: Every evening | ORAL | Status: DC | PRN
  Administered 2017-01-01: 25 mg via ORAL
  Filled 2016-12-31: qty 1

## 2016-12-31 MED ORDER — OSELTAMIVIR PHOSPHATE 75 MG PO CAPS
75.0000 mg | ORAL_CAPSULE | Freq: Two times a day (BID) | ORAL | Status: DC
  Administered 2017-01-01 – 2017-01-02 (×3): 75 mg via ORAL
  Filled 2016-12-31 (×3): qty 1

## 2016-12-31 MED ORDER — HYDROCODONE-ACETAMINOPHEN 5-325 MG PO TABS: 1.0000 | ORAL_TABLET | ORAL | Status: DC | PRN

## 2016-12-31 MED ORDER — INSULIN ASPART 100 UNIT/ML ~~LOC~~ SOLN
0.0000 [IU] | Freq: Three times a day (TID) | SUBCUTANEOUS | Status: DC
Start: 2017-01-01 — End: 2017-01-01
  Administered 2017-01-01: 5 [IU] via SUBCUTANEOUS
  Filled 2016-12-31: qty 5

## 2016-12-31 MED ORDER — SIMVASTATIN 40 MG PO TABS
40.0000 mg | ORAL_TABLET | Freq: Every day | ORAL | Status: DC
  Administered 2017-01-01 – 2017-01-02 (×2): 40 mg via ORAL
  Filled 2016-12-31 (×2): qty 1

## 2016-12-31 MED ORDER — SODIUM CHLORIDE 0.9 % IV BOLUS (SEPSIS)
1000.0000 mL | Freq: Once | INTRAVENOUS | Status: AC
  Administered 2016-12-31: 1000 mL via INTRAVENOUS

## 2016-12-31 MED ORDER — ASPIRIN EC 81 MG PO TBEC
81.0000 mg | DELAYED_RELEASE_TABLET | Freq: Every day | ORAL | Status: DC
  Administered 2017-01-01 – 2017-01-02 (×2): 81 mg via ORAL
  Filled 2016-12-31 (×2): qty 1

## 2016-12-31 NOTE — ED Notes (Signed)
CBG in exam room 302

## 2016-12-31 NOTE — H&P (Signed)
[facility] [place] NAME: Victor Dixon    MR#:  [id number]  DATE OF BIRTH:  10-12-1941  DATE OF ADMISSION:  12/31/2016  PRIMARY CARE PHYSICIAN: Ezequiel Kayser, MD   REQUESTING/REFERRING PHYSICIAN: Dr. Reita Cliche  CHIEF COMPLAINT:   Chief Complaint  Patient presents with  . Hyperglycemia    HISTORY OF PRESENT ILLNESS:  Victor Dixon  is a 76 y.o. male with a known history of Diabetes mellitus type 2, essential hypertension brought in by family because of lightheadedness, dizziness. Also noted to have low-grade fever, shortness of breath, cough since yesterday associated with poor appetite. Family thought he may be having a blood sugar because of episode of shaking that happened this morning. Patient had chills. When EMS arrived he was found to have hypoxia with saturation of low 80s, put on oxygen 4 L. Patient now on 2 L saturation is around 93%. He also had respiratory distress when he came. . He appears comfortable. PAST MEDICAL HISTORY:   Past Medical History:  Diagnosis Date  . Anemia   . Diabetes mellitus without complication ([place])   . GERD (gastroesophageal reflux disease)   . Hypertension     PAST SURGICAL HISTOIRY:   Past Surgical History:  Procedure Laterality Date  . APPENDECTOMY    . CAROTID ARTERY ANGIOPLASTY Right   . PERIPHERAL VASCULAR CATHETERIZATION Left 08/15/2016   Procedure: Lower Extremity Angiography;  Surgeon: Katha Cabal, MD;  Location: [place] [facility];  Service: Cardiovascular;  Laterality: Left;  . PERIPHERAL VASCULAR CATHETERIZATION  08/15/2016   Procedure: Lower Extremity Intervention;  Surgeon: Katha Cabal, MD;  Location: [place] [facility];  Service: Cardiovascular;;    SOCIAL HISTORY:   Social History  Substance Use Topics  . Smoking status: Former Research scientist (life sciences)  . Smokeless tobacco: Never Used  . Alcohol use No    FAMILY HISTORY:  No family history on file.  DRUG  ALLERGIES:  No Known Allergies  REVIEW OF SYSTEMS:  CONSTITUTIONAL: low grade fever,fatigue.  EYES: No blurred or double vision.  EARS, NOSE, AND THROAT: No tinnitus or ear pain.  RESPIRATORY:  cough, shortness of breath, no wheezing or hemoptysis.  CARDIOVASCULAR: No chest pain, orthopnea, edema.  GASTROINTESTINAL: No nausea, vomiting, diarrhea or abdominal pain.  GENITOURINARY: No dysuria, hematuria.  ENDOCRINE: No polyuria, nocturia,  HEMATOLOGY: No anemia, easy bruising or bleeding SKIN: No rash or lesion. MUSCULOSKELETAL: No joint pain or arthritis.   NEUROLOGIC: No tingling, numbness, weakness.  PSYCHIATRY: No anxiety or depression.   MEDICATIONS AT HOME:   Prior to Admission medications   Medication Sig Start Date End Date Taking? Authorizing Provider  aspirin EC 81 MG tablet Take 81 mg by mouth daily.   Yes Historical Provider, MD  carvedilol (COREG) 6.25 MG tablet Take 6.25 mg by mouth 2 (two) times daily with a meal.   Yes Historical Provider, MD  cilostazol (PLETAL) 100 MG tablet Take 100 mg by mouth 2 (two) times daily.   Yes Historical Provider, MD  Cyanocobalamin (VITAMIN B 12) 250 MCG LOZG Take 500 mcg by mouth every morning.   Yes Historical Provider, MD  ferrous sulfate 325 (65 FE) MG tablet Take 325 mg by mouth daily with breakfast.   Yes Historical Provider, MD  finasteride (PROSCAR) 5 MG tablet Take 5 mg by mouth daily.   Yes Historical Provider, MD  gabapentin (NEURONTIN) 300 MG capsule Take 300 mg by mouth 3 (three) times daily.   Yes  Historical Provider, MD  glimepiride (AMARYL) 2 MG tablet Take 2 mg by mouth daily with breakfast.   Yes Historical Provider, MD  losartan-hydrochlorothiazide (HYZAAR) 100-25 MG tablet Take 1 tablet by mouth daily.   Yes Historical Provider, MD  metFORMIN (GLUCOPHAGE-XR) 500 MG 24 hr tablet Take 500 mg by mouth 2 (two) times daily with a meal.   Yes Historical Provider, MD  simvastatin (ZOCOR) 40 MG tablet Take 40 mg by mouth daily.    Yes Historical Provider, MD  pantoprazole (PROTONIX) 40 MG tablet Take 40 mg by mouth daily.    Historical Provider, MD  sennosides-docusate sodium (SENOKOT-S) 8.6-50 MG tablet Take 1 tablet by mouth daily.    Historical Provider, MD      VITAL SIGNS:  Blood pressure 136/65, pulse (!) 105, temperature 100.1 F (37.8 C), temperature source Oral, resp. rate (!) 24, height 5\' 11"  (1.803 m), weight 96.2 kg (212 lb), SpO2 98 %.  PHYSICAL EXAMINATION:  GENERAL:  76 y.o.-year-old patient lying in the bed with no acute distress.  EYES: Pupils equal, round, reactive to light and accommodation. No scleral icterus. Extraocular muscles intact.  HEENT: Head atraumatic, normocephalic. Oropharynx and nasopharynx clear.  NECK:  Supple, no jugular venous distention. No thyroid enlargement, no tenderness.  LUNGS: Equal breath sounds bilaterally, no rales,rhonchi or crepitation. No use of accessory muscles of respiration.  CARDIOVASCULAR: S1, S2 normal. No murmurs, rubs, or gallops.  ABDOMEN: Soft, nontender, nondistended. Bowel sounds present. No organomegaly or mass.  EXTREMITIES: No pedal edema, cyanosis, or clubbing.  NEUROLOGIC: Cranial nerves II through XII are intact. Muscle strength 5/5 in all extremities. Sensation intact. Gait not checked.  PSYCHIATRIC: The patient is alert and oriented x 3.  SKIN: No obvious rash, lesion, or ulcer.   LABORATORY PANEL:   CBC  Recent Labs Lab 12/31/16 1501  WBC 7.6  HGB 8.2*  HCT 25.2*  PLT 250   ------------------------------------------------------------------------------------------------------------------  Chemistries   Recent Labs Lab 12/31/16 1637  NA 128*  K 3.8  CL 93*  CO2 27  GLUCOSE 282*  BUN 29*  CREATININE 0.94  CALCIUM 8.3*  AST 46*  ALT 28  ALKPHOS 49  BILITOT 0.9   ------------------------------------------------------------------------------------------------------------------  Cardiac Enzymes No results for  input(s): TROPONINI in the last 168 hours. ------------------------------------------------------------------------------------------------------------------  RADIOLOGY:  Dg Chest 2 View  Result Date: 12/31/2016 CLINICAL DATA:  Patient with cough and coarse lung sounds. EXAM: CHEST  2 VIEW COMPARISON:  Thoracic spine CT 11/22/2016 FINDINGS: Monitoring leads overlie the patient. Normal cardiac and mediastinal contours. Low lung volumes. Bilateral mid and lower lung heterogeneous pulmonary opacities. Mid thoracic spine degenerative changes. IMPRESSION: Low lung volumes with mid and lower lung heterogeneous opacities which may represent atelectasis or infection. Electronically Signed   By: Lovey Newcomer M.D.   On: 12/31/2016 16:20    EKG:   Orders placed or performed during the hospital encounter of 12/31/16  . ED EKG  . ED EKG  . EKG 12-Lead  . EKG 12-Lead   EKG shows sinus tachycardia 1 34 bpm, PACs. IMPRESSION AND PLAN:  76 year old male patient with essential hypertension, diabetes mellitus type 2, remote history of DVT while on anticoagulation before comes in because of shortness of breath, cough, dizziness and to have sepsis secondary to type A influenza, multifocal pneumonia,  #1 sepsis with elevated lactic acid up to 2.3 secondary to influenza type  A, pneumonia: Still tachycardic with heart rate up to 1 20 bpm. Admitted to hospitalist service on telemetry,  continue aggressive hydration, antibiotics with Rocephin, Zithromax, Tamiflu. Follow blood cultures.  #2 acute respiratory failure with hypoxia secondary to pneumonia: Continue oxygen and wean off the oxygen as tolerated to keep sats more than 90%.  #3; diabetes mellitus type 2: Hold the medicines metformin, Amaryl The current medical regimen is effective;  continue present plan and medications. ssi coverage, by mouth intake is improved.continue  #4 hyponatremia secondary to dehydration: Continue IV hydration, hold losartan with  HCTZ.  Hyperlipidemia; continue statins  Peripheral vascular disease: Continue aspirin, Pletal.  BPH ;continue Proscar.  GI, DVT prophylaxis. Personal patient's family. All the records are reviewed and case discussed with ED provider. Management plans discussed with the patient, family and they are in agreement.  CODE STATUS: full  TOTAL TIME TAKING CARE OF THIS PATIENT: 55 minutes.    Epifanio Lesches M.D on 12/31/2016 at 7:18 PM  Between 7am to 6pm - Pager - [phone]  After 6pm go to [email or web address] - password [email or web address] [facility]  Office  [phone]  CC: Primary care physician; Ezequiel Kayser, MD  Note: This dictation was prepared with Dragon dictation along with smaller phrase technology. Any transcriptional errors that result from this process are unintentional.

## 2016-12-31 NOTE — ED Notes (Signed)
Light green top redrawn and sent to lab.  

## 2016-12-31 NOTE — ED Notes (Signed)
Report called to floor att, Minna Merritts, RN,

## 2016-12-31 NOTE — ED Provider Notes (Signed)
[facility] Emergency Department Provider Note ____________________________________________   I have reviewed the triage vital signs and the triage nursing note.  HISTORY  Chief Complaint Hyperglycemia   Historian Patient, and family members  HPI Victor Dixon is a 76 y.o. male with history of diabetes for which she takes oral hypoglycemics, as well as hypertension, presents today after feeling lightheaded and dizzy at church. Family thought maybe his blood sugar was up and indeed it was elevated. He had some mild chills without known fever. Mild cough. EMS brought him in and found that he was hypoxic, and was placed on 2 L nasal cannula.  Patient denies abdominal pain or nausea or vomiting or diarrhea. Denies chest pain.    Past Medical History:  Diagnosis Date  . Anemia   . Diabetes mellitus without complication ([place])   . GERD (gastroesophageal reflux disease)   . Hypertension     Patient Active Problem List   Diagnosis Date Noted  . Atherosclerosis of native arteries of extremity with intermittent claudication ([place]) 12/28/2016  . Essential hypertension 12/28/2016  . Hyperlipidemia 12/28/2016  . Type 2 diabetes mellitus ([place]) 12/28/2016  . GERD (gastroesophageal reflux disease) 12/28/2016  . Chronic kidney disease 10/17/2016    Past Surgical History:  Procedure Laterality Date  . APPENDECTOMY    . CAROTID ARTERY ANGIOPLASTY Right   . PERIPHERAL VASCULAR CATHETERIZATION Left 08/15/2016   Procedure: Lower Extremity Angiography;  Surgeon: Katha Cabal, MD;  Location: [place] [facility];  Service: Cardiovascular;  Laterality: Left;  . PERIPHERAL VASCULAR CATHETERIZATION  08/15/2016   Procedure: Lower Extremity Intervention;  Surgeon: Katha Cabal, MD;  Location: [place] [facility];  Service: Cardiovascular;;    Prior to Admission medications   Medication Sig Start Date End Date Taking? Authorizing Provider  aspirin EC 81 MG  tablet Take 81 mg by mouth daily.   Yes Historical Provider, MD  carvedilol (COREG) 6.25 MG tablet Take 6.25 mg by mouth 2 (two) times daily with a meal.   Yes Historical Provider, MD  cilostazol (PLETAL) 100 MG tablet Take 100 mg by mouth 2 (two) times daily.   Yes Historical Provider, MD  Cyanocobalamin (VITAMIN B 12) 250 MCG LOZG Take 500 mcg by mouth every morning.   Yes Historical Provider, MD  ferrous sulfate 325 (65 FE) MG tablet Take 325 mg by mouth daily with breakfast.   Yes Historical Provider, MD  finasteride (PROSCAR) 5 MG tablet Take 5 mg by mouth daily.   Yes Historical Provider, MD  gabapentin (NEURONTIN) 300 MG capsule Take 300 mg by mouth 3 (three) times daily.   Yes Historical Provider, MD  glimepiride (AMARYL) 2 MG tablet Take 2 mg by mouth daily with breakfast.   Yes Historical Provider, MD  losartan-hydrochlorothiazide (HYZAAR) 100-25 MG tablet Take 1 tablet by mouth daily.   Yes Historical Provider, MD  metFORMIN (GLUCOPHAGE-XR) 500 MG 24 hr tablet Take 500 mg by mouth 2 (two) times daily with a meal.   Yes Historical Provider, MD  simvastatin (ZOCOR) 40 MG tablet Take 40 mg by mouth daily.   Yes Historical Provider, MD  pantoprazole (PROTONIX) 40 MG tablet Take 40 mg by mouth daily.    Historical Provider, MD  sennosides-docusate sodium (SENOKOT-S) 8.6-50 MG tablet Take 1 tablet by mouth daily.    Historical Provider, MD    No Known Allergies  No family history on file.  Social History Social History  Substance Use Topics  . Smoking status: Former  Smoker  . Smokeless tobacco: Never Used  . Alcohol use No    Review of Systems  Constitutional: Patient do not know he had a fever until it was positive for fever here in triage. Eyes: Negative for visual changes. ENT: Negative for sore throat. Cardiovascular: Negative for chest pain. Respiratory: Positive for mild shortness of breath and congestion. This started just today. Gastrointestinal: Negative for abdominal  pain, vomiting and diarrhea. Genitourinary: Negative for dysuria. Musculoskeletal: Negative for back pain. Skin: Negative for rash. Neurological: Negative for headache. 10 point Review of Systems otherwise negative ____________________________________________   PHYSICAL EXAM:  VITAL SIGNS: ED Triage Vitals  Enc Vitals Group     BP 12/31/16 1443 (!) 130/52     Pulse Rate 12/31/16 1443 (!) 123     Resp 12/31/16 1443 (!) 47     Temp 12/31/16 1443 100.2 F (37.9 C)     Temp Source 12/31/16 1443 Oral     SpO2 --      Weight 12/31/16 1445 212 lb (96.2 kg)     Height 12/31/16 1445 5\' 11"  (1.803 m)     Head Circumference --      Peak Flow --      Pain Score --      Pain Loc --      Pain Edu? --      Excl. in [place]? --      Constitutional: Alert and oriented. Well appearing and in no distress. HEENT   Head: Normocephalic and atraumatic.      Eyes: Conjunctivae are normal. PERRL. Normal extraocular movements.      Ears:         Nose: No congestion/rhinnorhea.   Mouth/Throat: Mucous membranes are moist.   Neck: No stridor. Cardiovascular/Chest: Tachycardic rate, regular rhythm.  No murmurs, rubs, or gallops. Respiratory: Normal respiratory effort without tachypnea nor retractions. Breath sounds are clear and equal bilaterally. Mild decreased air movement throughout, but no wheezes/rales/rhonchi. Gastrointestinal: Soft. No distention, no guarding, no rebound. Nontender.    Genitourinary/rectal:Deferred Musculoskeletal: Nontender with normal range of motion in all extremities. No joint effusions.  No lower extremity tenderness.  No edema. Neurologic:  Normal speech and language. No gross or focal neurologic deficits are appreciated. Skin:  Skin is warm, dry and intact. No rash noted. Psychiatric: Mood and affect are normal. Speech and behavior are normal. Patient exhibits appropriate insight and judgment.   ____________________________________________  LABS (pertinent  positives/negatives)  Labs Reviewed  GLUCOSE, CAPILLARY - Abnormal; Notable for the following:       Result Value   Glucose-Capillary 305 (*)    All other components within normal limits  CBC WITH DIFFERENTIAL/PLATELET - Abnormal; Notable for the following:    RBC 3.11 (*)    Hemoglobin 8.2 (*)    HCT 25.2 (*)    RDW 14.8 (*)    Neutro Abs 7.1 (*)    Lymphs Abs 0.2 (*)    All other components within normal limits  URINALYSIS, COMPLETE (UACMP) WITH MICROSCOPIC - Abnormal; Notable for the following:    Color, Urine AMBER (*)    APPearance HAZY (*)    Glucose, UA 50 (*)    Hgb urine dipstick MODERATE (*)    Protein, ur >=300 (*)    Squamous Epithelial / LPF 0-5 (*)    All other components within normal limits  INFLUENZA PANEL BY PCR (TYPE A & B) - Abnormal; Notable for the following:    Influenza A By PCR POSITIVE (*)  All other components within normal limits  LACTIC ACID, PLASMA - Abnormal; Notable for the following:    Lactic Acid, Venous 2.3 (*)    All other components within normal limits  COMPREHENSIVE METABOLIC PANEL - Abnormal; Notable for the following:    Sodium 128 (*)    Chloride 93 (*)    Glucose, Bld 282 (*)    BUN 29 (*)    Calcium 8.3 (*)    Albumin 2.9 (*)    AST 46 (*)    All other components within normal limits  CULTURE, BLOOD (ROUTINE X 2)  CULTURE, BLOOD (ROUTINE X 2)  LACTIC ACID, PLASMA    ____________________________________________    EKG I, Lisa Roca, MD, the attending physician have personally viewed and interpreted all ECGs.  134 bpm. Sinus tachycardia. Normal axis. Narrow QRS. Nonspecific ST and T-wave ____________________________________________  RADIOLOGY All Xrays were viewed by me. Imaging interpreted by Radiologist.  Chest x-ray two-view:  IMPRESSION: Low lung volumes with mid and lower lung heterogeneous opacities which may represent atelectasis or  infection. __________________________________________  PROCEDURES  Procedure(s) performed: None  Critical Care performed: CRITICAL CARE Performed by: Lisa Roca   Total critical care time: 30 minutes  Critical care time was exclusive of separately billable procedures and treating other patients.  Critical care was necessary to treat or prevent imminent or life-threatening deterioration.  Critical care was time spent personally by me on the following activities: development of treatment plan with patient and/or surrogate as well as nursing, discussions with consultants, evaluation of patient's response to treatment, examination of patient, obtaining history from patient or surrogate, ordering and performing treatments and interventions, ordering and review of laboratory studies, ordering and review of radiographic studies, pulse oximetry and re-evaluation of patient's condition.   ____________________________________________   ED COURSE / ASSESSMENT AND PLAN  Pertinent labs & imaging results that were available during my care of the patient were reviewed by me and considered in my medical decision making (see chart for details).   Found to have fever and hypoxia and body aches, concern for upper respiratory infectious process.  Placed on sepsis pathway, started on the vancomycin and Zosyn and a suspected respiratory source, but chest x-ray not highly convincing.  Influenza A did come back positive, patient was also started on Tamiflu in addition.  No hypotension. Fluid resuscitation based clinically, slightly elevated lactate. He is tachycardic.   Stable on 2 L nasal cannula after being hypoxic to about 86% on room air.    CONSULTATIONS:   Hospitalist for admission.   Patient / Family / Caregiver informed of clinical course, medical decision-making process, and agree with plan.  ___________________________________________   FINAL CLINICAL IMPRESSION(S) / ED  DIAGNOSES   Final diagnoses:  Hypoxia  Influenza A  Multifocal pneumonia  Sepsis, due to unspecified organism [facility])              Note: This dictation was prepared with Dragon dictation. Any transcriptional errors that result from this process are unintentional    Lisa Roca, MD 12/31/16 1819

## 2016-12-31 NOTE — Progress Notes (Signed)
Pharmacy Antibiotic Note  Victor Dixon is a 76 y.o. male admitted on 12/31/2016 with pneumonia and Influenza A.  Pharmacy has been consulted for Zosyn and Vancomycin dosing.  Ke=0.073 T1/2=9.5hr Vd=67.3L  Plan: Vancomycin 1500 IV every 12 hours.  Goal trough 15-20 mcg/mL. Zosyn 3.375g IV q8h (4 hour infusion). Will check a trough level prior to 4th dose. Follow up on PCT levels.  Height: 5\' 11"  (180.3 cm) Weight: 212 lb (96.2 kg) IBW/kg (Calculated) : 75.3  Temp (24hrs), Avg:100.2 F (37.9 C), Min:100.1 F (37.8 C), Max:100.2 F (37.9 C)   Recent Labs Lab 12/31/16 1501 12/31/16 1513 12/31/16 1637 12/31/16 1751  WBC 7.6  --   --   --   CREATININE  --   --  0.94  --   LATICACIDVEN  --  2.3*  --  1.4    Estimated Creatinine Clearance: 82.9 mL/min (by C-G formula based on SCr of 0.94 mg/dL).    No Known Allergies  Antimicrobials this admission: Vancomycin 2/18 >>  Zosyn 2/18 >>  Tamiflu 2/18 >>   Thank you for allowing pharmacy to be a part of this patient's care.  Paulina Fusi, PharmD, BCPS 12/31/2016 8:07 PM

## 2016-12-31 NOTE — ED Notes (Signed)
Call to pharm for TAMIFLU dosage verification

## 2016-12-31 NOTE — ED Triage Notes (Signed)
Pt presents to ER via [facility] with complaints of high blood sugar and SPO2 86% on RA, EMS administered Albuterol, Tylenol 975 mg for temperature of 100.46F. BP 143/73 HR 130. Pt denies any shortness of breath, reports had a cold 2 days ago and took oTC med symptoms resolved, pt had loose stool this morning. Pt denies any chest pain, talks in complete sentences Oxygen saturation IN RA in exam room 85%. 4LNC Spo2 92%

## 2016-12-31 NOTE — ED Notes (Signed)
unable to call report d/t unstable EMS to 18H att

## 2017-01-01 LAB — GLUCOSE, CAPILLARY
GLUCOSE-CAPILLARY: 278 mg/dL — AB (ref 65–99)
GLUCOSE-CAPILLARY: 185 mg/dL — AB (ref 65–99)
GLUCOSE-CAPILLARY: 232 mg/dL — AB (ref 65–99)
Glucose-Capillary: 268 mg/dL — ABNORMAL HIGH (ref 65–99)

## 2017-01-01 LAB — BASIC METABOLIC PANEL
Anion gap: 6 (ref 5–15)
BUN: 18 mg/dL (ref 6–20)
CALCIUM: 8.7 mg/dL — AB (ref 8.9–10.3)
CO2: 31 mmol/L (ref 22–32)
CREATININE: 0.78 mg/dL (ref 0.61–1.24)
Chloride: 97 mmol/L — ABNORMAL LOW (ref 101–111)
GFR calc Af Amer: >60 mL/min (ref >60)
GFR calc non Af Amer: >60 mL/min (ref >60)
Glucose, Bld: 299 mg/dL — ABNORMAL HIGH (ref 65–99)
Potassium: 4.4 mmol/L (ref 3.5–5.1)
Sodium: 134 mmol/L — ABNORMAL LOW (ref 135–145)

## 2017-01-01 LAB — CBC
HCT: 27.1 % — ABNORMAL LOW (ref 40.0–52.0)
Hemoglobin: 8.6 g/dL — ABNORMAL LOW (ref 13.0–18.0)
MCH: 26 pg (ref 26.0–34.0)
MCHC: 31.9 g/dL — ABNORMAL LOW (ref 32.0–36.0)
MCV: 81.6 fL (ref 80.0–100.0)
PLATELETS: 306 10*3/uL (ref 150–440)
RBC: 3.32 MIL/uL — ABNORMAL LOW (ref 4.40–5.90)
RDW: 15 % — ABNORMAL HIGH (ref 11.5–14.5)
WBC: 13.6 10*3/uL — ABNORMAL HIGH (ref 3.8–10.6)

## 2017-01-01 MED ORDER — METHYLPREDNISOLONE SODIUM SUCC 40 MG IJ SOLR
40.0000 mg | Freq: Every day | INTRAMUSCULAR | Status: DC
  Administered 2017-01-01: 40 mg via INTRAVENOUS
  Filled 2017-01-01 (×2): qty 1

## 2017-01-01 MED ORDER — AZITHROMYCIN 500 MG PO TABS
500.0000 mg | ORAL_TABLET | Freq: Every day | ORAL | Status: AC
  Administered 2017-01-01: 500 mg via ORAL
  Filled 2017-01-01: qty 1

## 2017-01-01 MED ORDER — INSULIN ASPART 100 UNIT/ML ~~LOC~~ SOLN
0.0000 [IU] | Freq: Three times a day (TID) | SUBCUTANEOUS | Status: DC
  Administered 2017-01-01: 8 [IU] via SUBCUTANEOUS
  Administered 2017-01-01 – 2017-01-02 (×2): 3 [IU] via SUBCUTANEOUS
  Filled 2017-01-01 (×2): qty 3
  Filled 2017-01-01: qty 8

## 2017-01-01 MED ORDER — BUDESONIDE 0.5 MG/2ML IN SUSP
0.5000 mg | Freq: Two times a day (BID) | RESPIRATORY_TRACT | Status: DC
  Administered 2017-01-01 (×2): 0.5 mg via RESPIRATORY_TRACT
  Filled 2017-01-01 (×3): qty 2

## 2017-01-01 MED ORDER — AZITHROMYCIN 250 MG PO TABS
250.0000 mg | ORAL_TABLET | Freq: Every day | ORAL | Status: DC
  Administered 2017-01-02: 250 mg via ORAL
  Filled 2017-01-01: qty 1

## 2017-01-01 MED ORDER — IPRATROPIUM-ALBUTEROL 0.5-2.5 (3) MG/3ML IN SOLN
3.0000 mL | Freq: Four times a day (QID) | RESPIRATORY_TRACT | Status: DC
  Administered 2017-01-01 – 2017-01-02 (×3): 3 mL via RESPIRATORY_TRACT
  Filled 2017-01-01 (×4): qty 3

## 2017-01-01 MED ORDER — IPRATROPIUM-ALBUTEROL 0.5-2.5 (3) MG/3ML IN SOLN: 3.0000 mL | Freq: Four times a day (QID) | RESPIRATORY_TRACT | Status: DC | PRN

## 2017-01-01 MED ORDER — INSULIN ASPART 100 UNIT/ML ~~LOC~~ SOLN
0.0000 [IU] | Freq: Every day | SUBCUTANEOUS | Status: DC
Start: 2017-01-01 — End: 2017-01-02
  Administered 2017-01-01: 2 [IU] via SUBCUTANEOUS
  Filled 2017-01-01: qty 2

## 2017-01-01 MED ORDER — DEXTROSE 5 % IV SOLN
1.0000 g | INTRAVENOUS | Status: DC
  Administered 2017-01-01: 1 g via INTRAVENOUS
  Filled 2017-01-01 (×2): qty 10

## 2017-01-01 NOTE — Progress Notes (Signed)
Patient ID: Victor Dixon, male   DOB: Sep 15, 1941, 76 y.o.   MRN: [id number]  [facility] PROGRESS NOTE  Victor Dixon [id number] DOB: [birth year] DOA: 12/31/2016 PCP: Ezequiel Kayser, MD  HPI/Subjective: Patient feeling better today than yesterday. He stated he presented with some shaking chills. And not feeling well and feeling very weak. Slight cough. Some shortness of breath.  Objective: Vitals:   01/01/17 0754 01/01/17 1216  BP: (!) 130/57 129/71  Pulse: (!) 52 95  Resp: 18 18  Temp: 97.4 F (36.3 C) 98 F (36.7 C)    Filed Weights   12/31/16 1445 01/01/17 0511  Weight: 96.2 kg (212 lb) 94 kg (207 lb 3.2 oz)    ROS: Review of Systems  Constitutional: Negative for chills and fever.  Eyes: Negative for blurred vision.  Respiratory: Positive for cough and shortness of breath.   Cardiovascular: Negative for chest pain.  Gastrointestinal: Negative for abdominal pain, constipation, diarrhea, nausea and vomiting.  Genitourinary: Negative for dysuria.  Musculoskeletal: Negative for joint pain.  Neurological: Positive for weakness. Negative for dizziness and headaches.   Exam: Physical Exam  Constitutional: He is oriented to person, place, and time.  HENT:  Nose: No mucosal edema.  Mouth/Throat: No oropharyngeal exudate or posterior oropharyngeal edema.  Eyes: Conjunctivae, EOM and lids are normal. Pupils are equal, round, and reactive to light.  Neck: No JVD present. Carotid bruit is not present. No edema present. No thyroid mass and no thyromegaly present.  Cardiovascular: S1 normal and S2 normal.  Exam reveals no gallop.   No murmur heard. Pulses:      Dorsalis pedis pulses are 2+ on the right side, and 2+ on the left side.  Respiratory: No respiratory distress. He has decreased breath sounds in the right middle field, the right lower field, the left middle field and the left lower field. He has wheezes in the right middle field, the right lower field, the left  middle field and the left lower field. He has no rhonchi. He has no rales.  GI: Soft. Bowel sounds are normal. There is no tenderness.  Musculoskeletal:       Right ankle: He exhibits swelling.       Left ankle: He exhibits swelling.  Lymphadenopathy:    He has no cervical adenopathy.  Neurological: He is alert and oriented to person, place, and time. No cranial nerve deficit.  Skin: Skin is warm. No rash noted. Nails show no clubbing.  Psychiatric: He has a normal mood and affect.      Data Reviewed: Basic Metabolic Panel:  Recent Labs Lab 12/31/16 1637 01/01/17 0522  NA 128* 134*  K 3.8 4.4  CL 93* 97*  CO2 27 31  GLUCOSE 282* 299*  BUN 29* 18  CREATININE 0.94 0.78  CALCIUM 8.3* 8.7*   Liver Function Tests:  Recent Labs Lab 12/31/16 1637  AST 46*  ALT 28  ALKPHOS 49  BILITOT 0.9  PROT 6.7  ALBUMIN 2.9*   CBC:  Recent Labs Lab 12/31/16 1501 01/01/17 0522  WBC 7.6 13.6*  NEUTROABS 7.1*  --   HGB 8.2* 8.6*  HCT 25.2* 27.1*  MCV 81.1 81.6  PLT 250 306    CBG:  Recent Labs Lab 12/31/16 1451 01/01/17 0735 01/01/17 1218  GLUCAP 305* 278* 268*    Recent Results (from the past 240 hour(s))  Culture, blood (routine x 2)     Status: None (Preliminary result)   Collection Time: 12/31/16  3:01 PM  Result  Value Ref Range Status   Specimen Description BLOOD RIGHT HAND  Final   Special Requests BOTTLES DRAWN AEROBIC AND ANAEROBIC BCAV  Final   Culture NO GROWTH < 24 HOURS  Final   Report Status PENDING  Incomplete  Culture, blood (routine x 2)     Status: None (Preliminary result)   Collection Time: 12/31/16  3:01 PM  Result Value Ref Range Status   Specimen Description BLOOD LEFT ASSIST CONTROL  Final   Special Requests BOTTLES DRAWN AEROBIC AND ANAEROBIC BCAV  Final   Culture NO GROWTH < 24 HOURS  Final   Report Status PENDING  Incomplete     Studies: Dg Chest 2 View  Result Date: 12/31/2016 CLINICAL DATA:  Patient with cough and coarse lung  sounds. EXAM: CHEST  2 VIEW COMPARISON:  Thoracic spine CT 11/22/2016 FINDINGS: Monitoring leads overlie the patient. Normal cardiac and mediastinal contours. Low lung volumes. Bilateral mid and lower lung heterogeneous pulmonary opacities. Mid thoracic spine degenerative changes. IMPRESSION: Low lung volumes with mid and lower lung heterogeneous opacities which may represent atelectasis or infection. Electronically Signed   By: Lovey Newcomer M.D.   On: 12/31/2016 16:20    Scheduled Meds: . aspirin EC  81 mg Oral Daily  . [START ON 01/02/2017] azithromycin  250 mg Oral Daily  . budesonide (PULMICORT) nebulizer solution  0.5 mg Nebulization BID  . carvedilol  6.25 mg Oral BID WC  . cefTRIAXone (ROCEPHIN)  IV  1 g Intravenous Q24H  . cilostazol  100 mg Oral BID  . cyanocobalamin  500 mcg Oral q morning - 10a  . docusate sodium  100 mg Oral BID  . enoxaparin (LOVENOX) injection  40 mg Subcutaneous Q24H  . ferrous sulfate  325 mg Oral Q breakfast  . finasteride  5 mg Oral Daily  . gabapentin  300 mg Oral TID  . insulin aspart  0-15 Units Subcutaneous TID WC  . insulin aspart  0-5 Units Subcutaneous QHS  . ipratropium-albuterol  3 mL Nebulization Q6H  . methylPREDNISolone (SOLU-MEDROL) injection  40 mg Intravenous Daily  . oseltamivir  75 mg Oral BID  . pantoprazole  40 mg Oral Daily  . senna-docusate  1 tablet Oral Daily  . simvastatin  40 mg Oral Daily    Assessment/Plan:  1. Clinical sepsis, possible pneumonia, influenza a bronchitis, tachycardia, fever. Patient started on aggressive antibiotics with vancomycin and Zosyn. Since he came from home I switched antibiotics to Zithromax and Rocephin. Not quite sure if there is a pneumonia. Influenza A positive with bronchitis. Start Solu-Medrol, DuoNeb and budesonide nebulizers. Patient also on Tamiflu. 2. Hyperlipidemia unspecified on Zocor 3. GERD on Protonix 4. Type 2 diabetes mellitus. On sliding scale. Sugars will be high with  steroids. 5. BPH on finasteride 6. Essential hypertension on Coreg  Code Status:     Code Status Orders        Start     Ordered   12/31/16 1835  Full code  Continuous     12/31/16 1835    Code Status History    Date Active Date Inactive Code Status Order ID Comments User Context   08/15/2016  9:25 AM 08/15/2016  5:23 PM Full Code [id number]  Katha Cabal, MD Inpatient    Advance Directive Documentation   Flowsheet Row Most Recent Value  Type of Advance Directive  Healthcare Power of Attorney  Pre-existing out of facility DNR order (yellow form or pink MOST form)  No data  "MOST"  Form in Place?  No data      Disposition Plan: Home once breathing better  Antibiotics:  Rocephin  Zithromax  Tamiflu  Time spent: 28 minutes  Loletha Grayer  [employer]

## 2017-01-01 NOTE — Progress Notes (Signed)
Patient lost his wife a week ago and an ambulance picked him up on Sunday when he was at church. Pt. Has no children because he never had children from his two marriages. When his first wife died, he remarried and then his second wife died too. Chaplain supported patient emotionally and prayed with him before leaving the room.

## 2017-01-01 NOTE — Clinical Social Work Note (Signed)
CSW received consult that patient has a large copay for one of his meds, case manager can assist with this please consult with case manager.  CSW to sign off please reconsult if other social work needs arise.  Jones Broom. Graves, MSW, [place]  01/01/2017 9:43 AM

## 2017-01-01 NOTE — Progress Notes (Signed)
Inpatient Diabetes Program Recommendations  AACE/ADA: New Consensus Statement on Inpatient Glycemic Control (2015)  Target Ranges:  Prepandial:   less than 140 mg/dL      Peak postprandial:   less than 180 mg/dL (1-2 hours)      Critically ill patients:  140 - 180 mg/dL  Results for COYOTE, MAGALHAES (MRN [id number]) as of 01/01/2017 08:50  Ref. Range 12/31/2016 14:51 01/01/2017 07:35  Glucose-Capillary Latest Ref Range: 65 - 99 mg/dL 305 (H) 278 (H)   Results for DORIAN, GRIZZARD (MRN [id number]) as of 01/01/2017 08:50  Ref. Range 12/31/2016 16:37 01/01/2017 05:22  Glucose Latest Ref Range: 65 - 99 mg/dL 282 (H) 299 (H)   Review of Glycemic Control  Diabetes history: DM2 Outpatient Diabetes medications: Amaryl 2 mg QAM, Metformin XR 500 mg BID Current orders for Inpatient glycemic control: Novolog 0-9 units TID with meals  Inpatient Diabetes Program Recommendations: Correction (SSI): Please consider increasing Novolog to Moderate scale (0-15 units TID) and adding Novolog 0-5 units QHS for bedtime correction. HgbA1C: Per chart review, noted in Care Everywhere last A1C was 6.2% on 10/20/16.  NOTE: Initial glucose was 305 mg/dl on 12/31/16 and no insulin was given until this morning for CBG of 278 mg/dl.   Thanks, Barnie Alderman, RN, MSN, CDE Diabetes Coordinator Inpatient Diabetes Program [phone] (Team Pager from 8am to 5pm)

## 2017-01-02 LAB — GLUCOSE, CAPILLARY
Glucose-Capillary: 183 mg/dL — ABNORMAL HIGH (ref 65–99)
Glucose-Capillary: 146 mg/dL — ABNORMAL HIGH (ref 65–99)

## 2017-01-02 LAB — PROCALCITONIN: PROCALCITONIN: 4.33 ng/mL

## 2017-01-02 MED ORDER — AZITHROMYCIN 250 MG PO TABS: ORAL_TABLET | ORAL | 0 refills | Status: DC

## 2017-01-02 MED ORDER — PREDNISONE 20 MG PO TABS
20.0000 mg | ORAL_TABLET | Freq: Every day | ORAL | Status: DC
  Administered 2017-01-02: 20 mg via ORAL
  Filled 2017-01-02: qty 1

## 2017-01-02 MED ORDER — GLIMEPIRIDE 2 MG PO TABS
2.0000 mg | ORAL_TABLET | Freq: Every day | ORAL | Status: DC
  Administered 2017-01-02: 2 mg via ORAL
  Filled 2017-01-02: qty 1

## 2017-01-02 MED ORDER — CEFUROXIME AXETIL 500 MG PO TABS: 500.0000 mg | ORAL_TABLET | Freq: Two times a day (BID) | ORAL | 0 refills | Status: DC

## 2017-01-02 MED ORDER — PREDNISONE 5 MG PO TABS
ORAL_TABLET | ORAL | 0 refills | Status: DC
Start: 2017-01-02 — End: 2017-12-26

## 2017-01-02 MED ORDER — CEFUROXIME AXETIL 500 MG PO TABS
500.0000 mg | ORAL_TABLET | Freq: Two times a day (BID) | ORAL | Status: DC
  Administered 2017-01-02: 500 mg via ORAL
  Filled 2017-01-02: qty 1

## 2017-01-02 MED ORDER — OSELTAMIVIR PHOSPHATE 75 MG PO CAPS: 75.0000 mg | ORAL_CAPSULE | Freq: Two times a day (BID) | ORAL | 0 refills | Status: DC

## 2017-01-02 MED ORDER — ALBUTEROL SULFATE HFA 108 (90 BASE) MCG/ACT IN AERS: 2.0000 | INHALATION_SPRAY | Freq: Four times a day (QID) | RESPIRATORY_TRACT | 0 refills | Status: DC | PRN

## 2017-01-02 NOTE — Progress Notes (Signed)
IVs and tele removed from patient. Discharge instructions given to patient along with hard copy prescriptions. Patient verbalized understanding. No distress at this time. Niece will be transporting patient home.

## 2017-01-02 NOTE — Evaluation (Signed)
Physical Therapy Evaluation Patient Details Name: Victor Dixon MRN: [id number] DOB: [birth year] Today's Date: 01/02/2017   History of Present Illness  pt is a pleasant 76 yo male, on sunday 02/18 noticed SOB, cough with dizziness and low grade fever, admitted to [facility] and found to be hypoxic w/ acute respiratory failure, diagnosed with Influenza A. PMH includes type II diabetes and HTN    Clinical Impression  Pt awake, alert, oriented and displayed good safety awareness throughout PT eval. Stated he was feeling much better today w/ no signs of respiratory distress or hypoxia, O2 stayed above 98% on room air throughout session. Pt displays good overall strength and mobility and able to perform transfers and ambulation under PT supervision and use of Rw. Trunk remains slightly flexed in stance secondary to history of low back impairments. He does display some balance deficits and requires min assistance w/ higher level balance activities such as; single leg stance and tandem standing. Performed 5 times sit to stand in 17 seconds indicating increased fall risk. Educated patient on importance of using RW when at home to improve balance and stability. Pt will benefit from skilled PT to further improve balance and ambulatory deficits, recommend HHPT following acute hospital stay.      Follow Up Recommendations Home health PT    Equipment Recommendations       Recommendations for Other Services       Precautions / Restrictions Precautions Precautions: Fall Restrictions Weight Bearing Restrictions: No      Mobility  Bed Mobility               General bed mobility comments: not formally assessed patient was in chair upon entrance  Transfers Overall transfer level: Modified independent Equipment used: Rolling walker (2 wheeled)             General transfer comment: use of UE for push off, remains slighlty flexed throughout transfer and in  standing  Ambulation/Gait Ambulation/Gait assistance: Supervision Ambulation Distance (Feet): 200 Feet Assistive device: Rolling walker (2 wheeled) Gait Pattern/deviations: Step-through pattern;Decreased stride length;Trunk flexed     General Gait Details: pt ambulated w/ flexed trunk secondary to OA of low back, decreased stride length but no LOB or staggering, appears more stable and safe w/ RW  Stairs            Wheelchair Mobility    Modified Rankin (Stroke Patients Only)       Balance Overall balance assessment: Needs assistance Sitting-balance support: No upper extremity supported;Feet supported Sitting balance-Leahy Scale: Good Sitting balance - Comments: slightly forward flexed posture, able to maintain sitting position w/o assistance   Standing balance support: Bilateral upper extremity supported;During functional activity Standing balance-Leahy Scale: Good Standing balance comment: forwardly flexed trunk in stance, more stable w/ RW, displays difficulty w/ higher level balance activities, able to perform functional hygiene and wash face in standing  Single Leg Stance - Right Leg: 1 (secs) Single Leg Stance - Left Leg: 1 (secs) Tandem Stance - Right Leg: 2 (secs) Tandem Stance - Left Leg: 2 (secs) Rhomberg - Eyes Opened: 45 (secs) Rhomberg - Eyes Closed: 30 (secs)   High Level Balance Comments: reaching for support w/ single leg stance and tandem stance activities             Pertinent Vitals/Pain Pain Assessment: No/denies pain    Home Living Family/patient expects to be discharged to:: Private residence Living Arrangements: Alone Available Help at Discharge: Available PRN/intermittently;Family;Friend(s) (niece checks on him daily, neighbor also  checks in on him intermittently) Type of Home: House Home Access: Level entry     Home Layout: One level Home Equipment: Walker - 2 wheels      Prior Function Level of Independence: Independent with  assistive device(s)         Comments: able to perform ADLs and IADLs, community ambulator at baseline, attends church services and activities, is retired Teaching laboratory technician   Dominant Hand: Right    Extremity/Trunk Assessment   Upper Extremity Assessment Upper Extremity Assessment: Overall WFL for tasks assessed    Lower Extremity Assessment Lower Extremity Assessment: Overall WFL for tasks assessed    Cervical / Trunk Assessment Cervical / Trunk Assessment: Kyphotic  Communication   Communication: No difficulties  Cognition Arousal/Alertness: Awake/alert Behavior During Therapy: WFL for tasks assessed/performed Overall Cognitive Status: Within Functional Limits for tasks assessed                      General Comments General comments (skin integrity, edema, etc.): 5 time sit to stand in 17 seconds    Exercises     Assessment/Plan    PT Assessment Patient needs continued PT services  PT Problem List Decreased balance;Decreased knowledge of use of DME;Decreased mobility       PT Treatment Interventions Gait training;Stair training;Functional mobility training;Balance training;Therapeutic exercise;Therapeutic activities;Patient/family education;DME instruction    PT Goals (Current goals can be found in the Care Plan section)  Acute Rehab PT Goals Patient Stated Goal: Return home PT Goal Formulation: With patient Time For Goal Achievement: 01/16/17 Potential to Achieve Goals: Good    Frequency Min 2X/week   Barriers to discharge        Co-evaluation               End of Session Equipment Utilized During Treatment: Gait belt Activity Tolerance: Patient tolerated treatment well Patient left: in chair;with call bell/phone within reach Nurse Communication: Mobility status PT Visit Diagnosis: Unsteadiness on feet (R26.81)         Time: [id number] PT Time Calculation (min) (ACUTE ONLY): 23 min   Charges:         PT G Codes:          [employer] Student PT 01/02/2017, 11:33 AM

## 2017-01-02 NOTE — Progress Notes (Signed)
Inpatient Diabetes Program Recommendations  AACE/ADA: New Consensus Statement on Inpatient Glycemic Control (2015)  Target Ranges:  Prepandial:   less than 140 mg/dL      Peak postprandial:   less than 180 mg/dL (1-2 hours)      Critically ill patients:  140 - 180 mg/dL   Lab Results  Component Value Date   GLUCAP 183 (H) 01/02/2017   Per chart review, noted in Care Everywhere last A1C was 6.2% on 10/20/16. Review of Glycemic Control  Results for DOAN, PORRO (MRN [id number]) as of 01/02/2017 08:53  Ref. Range 08/15/2016 07:29 12/31/2016 14:51 01/01/2017 07:35 01/01/2017 12:18 01/01/2017 17:54 01/01/2017 21:34  Glucose-Capillary Latest Ref Range: 65 - 99 mg/dL 153 (H) 305 (H) 278 (H) 268 (H) 185 (H) 232 (H)    Diabetes history: DM2 Outpatient Diabetes medications: Amaryl 2 mg QAM, Metformin XR 500 mg BID Current orders for Inpatient glycemic control: Novolog 0-15 units TID with meals, Novolog 0-5 units qhs, 2mg  Amaryl qday  Inpatient Diabetes Program Recommendations: Please consider d/c Amaryl while inpatient. . Per ADA guidelines "In most instances in the hospital setting, insulin is the preferred treatment for glycemicControl"   Consider low dose basal insulin while on steroids, Lantus 10 units qday starting tomorrow.   Gentry Fitz, RN, BA, MHA, CDE Diabetes Coordinator Inpatient Diabetes Program  [phone] (Team Pager) [phone] ([place]) 01/02/2017 9:11 AM

## 2017-01-02 NOTE — Care Management Important Message (Signed)
Important Message  Patient Details  Name: Eduardo Menge MRN: [id number] Date of Birth: [birth year]   Medicare Important Message Given:  N/A - LOS <3 / Initial given by admissions    Katrina Stack, RN 01/02/2017, 9:38 AM

## 2017-01-02 NOTE — Care Management (Signed)
Informed that patient is independent in his room and is for discharge  home today. Informed there are no needs.  later informed patient verbalized concern about his copay on his gabapentin. Confirmed with [facility] that when patient filled the medication jan 2018, the copay was 112.10.  Most likely this is due to the deductible starting for the new year.  Provided patient with a good Rx coupon for CVA for 28.25. Instructed patient to call his insurance company to discuss his copays and deductible

## 2017-01-02 NOTE — Discharge Summary (Addendum)
Discharge [place]  Patient ID: Jevan Bertelsen, male   DOB: 1940-11-14, 76 y.o.   MRN: [id number] [facility] NAME: Oluwadarasimi Kucera    MR#:  [id number]  [place]:  Oct 04, 1941  DATE OF ADMISSION:  12/31/2016 ADMITTING PHYSICIAN: Epifanio Lesches, MD  DATE OF DISCHARGE: 01/02/2017 12:04 PM  PRIMARY CARE PHYSICIAN: THIES, DAVID, MD    ADMISSION DIAGNOSIS:  Influenza A [J10.1] Hypoxia [R09.02] Sepsis, due to unspecified organism ([place]) [A41.9] Multifocal pneumonia [J18.9]  DISCHARGE DIAGNOSIS:  Active Problems:   Pneumonia   SECONDARY DIAGNOSIS:   Past Medical History:  Diagnosis Date  . Anemia   . Diabetes mellitus without complication ([place])   . GERD (gastroesophageal reflux disease)   . Hypertension     HOSPITAL COURSE:   1. Clinical sepsis with possible pneumonia and influenza A bronchitis, tachycardia and fever. Patient initially started on aggressive antibiotics with vancomycin and Zosyn. Since he came from home I switched antibiotics to Zithromax and Rocephin. I'm not quite sure if there is a pneumonia. Influenza A positive with bronchitis. I started Solu-Medrol and nebulizer treatments and Tamiflu. Patient will go home on Tamiflu, prednisone taper, albuterol inhaler, Ceftin and Zithromax. Patient's lungs were clear upon discharge home. 2. Hyperlipidemia unspecified on Zocor 3. GERD on Protonix 4. Type 2 diabetes mellitus. Taper off steroids quickly. Go back on usual oral medications 5. BPH on finasteride 6. Essential hypertension on Coreg 7. Anemia of chronic disease  DISCHARGE CONDITIONS:   Satisfactory  CONSULTS OBTAINED:   None DRUG ALLERGIES:  No Known Allergies  DISCHARGE MEDICATIONS:   Discharge Medication List as of 01/02/2017 10:28 AM    START taking these medications   Details  albuterol (PROVENTIL HFA;VENTOLIN HFA) 108 (90 Base) MCG/ACT inhaler Inhale 2 puffs into the lungs every 6 (six)  hours as needed for wheezing or shortness of breath., Starting Tue 01/02/2017, Print    azithromycin (ZITHROMAX) 250 MG tablet One tab daily for three days, Print    cefUROXime (CEFTIN) 500 MG tablet Take 1 tablet (500 mg total) by mouth 2 (two) times daily with a meal., Starting Tue 01/02/2017, Print    oseltamivir (TAMIFLU) 75 MG capsule Take 1 capsule (75 mg total) by mouth 2 (two) times daily., Starting Tue 01/02/2017, Print    predniSONE (DELTASONE) 5 MG tablet 4 tabs po day1; 3 tabs po day2; 2 tabs po day3; 1 tab po day4, Print      CONTINUE these medications which have NOT CHANGED   Details  aspirin EC 81 MG tablet Take 81 mg by mouth daily., Historical Med    carvedilol (COREG) 6.25 MG tablet Take 6.25 mg by mouth 2 (two) times daily with a meal., Historical Med    cilostazol (PLETAL) 100 MG tablet Take 100 mg by mouth 2 (two) times daily., Historical Med    Cyanocobalamin (VITAMIN B 12) 250 MCG LOZG Take 500 mcg by mouth every morning., Historical Med    ferrous sulfate 325 (65 FE) MG tablet Take 325 mg by mouth daily with breakfast., Historical Med    finasteride (PROSCAR) 5 MG tablet Take 5 mg by mouth daily., Historical Med    gabapentin (NEURONTIN) 300 MG capsule Take 300 mg by mouth 3 (three) times daily., Historical Med    glimepiride (AMARYL) 2 MG tablet Take 2 mg by mouth daily with breakfast., Historical Med    metFORMIN (GLUCOPHAGE-XR) 500 MG 24 hr tablet Take 500 mg by mouth 2 (two)  times daily with a meal., Historical Med    simvastatin (ZOCOR) 40 MG tablet Take 40 mg by mouth daily., Historical Med    pantoprazole (PROTONIX) 40 MG tablet Take 40 mg by mouth daily., Historical Med    sennosides-docusate sodium (SENOKOT-S) 8.6-50 MG tablet Take 1 tablet by mouth daily., Historical Med      STOP taking these medications     losartan-hydrochlorothiazide (HYZAAR) 100-25 MG tablet          DISCHARGE INSTRUCTIONS:   Follow-up with PMD one week  If you  experience worsening of your admission symptoms, develop shortness of breath, life threatening emergency, suicidal or homicidal thoughts you must seek medical attention immediately by calling 911 or calling your MD immediately  if symptoms less severe.  You Must read complete instructions/literature along with all the possible adverse reactions/side effects for all the Medicines you take and that have been prescribed to you. Take any new Medicines after you have completely understood and accept all the possible adverse reactions/side effects.   Please note  You were cared for by a hospitalist during your hospital stay. If you have any questions about your discharge medications or the care you received while you were in the hospital after you are discharged, you can call the unit and asked to speak with the hospitalist on call if the hospitalist that took care of you is not available. Once you are discharged, your primary care physician will handle any further medical issues. Please note that NO REFILLS for any discharge medications will be authorized once you are discharged, as it is imperative that you return to your primary care physician (or establish a relationship with a primary care physician if you do not have one) for your aftercare needs so that they can reassess your need for medications and monitor your lab values.    Today   CHIEF COMPLAINT:   Chief Complaint  Patient presents with  . Hyperglycemia    HISTORY OF PRESENT ILLNESS:  Emmons Eisemann  is a 75 y.o. male presented with shortness of breath   VITAL SIGNS:  Blood pressure 135/88, pulse 93, temperature 97.4 F (36.3 C), temperature source Oral, resp. rate 17, height 5\' 11"  (1.803 m), weight 94.3 kg (207 lb 12.8 oz), SpO2 97 %.   PHYSICAL EXAMINATION:  GENERAL:  76 y.o.-year-old patient lying in the bed with no acute distress.  EYES: Pupils equal, round, reactive to light and accommodation. No scleral icterus.  Extraocular muscles intact.  HEENT: Head atraumatic, normocephalic. Oropharynx and nasopharynx clear.  NECK:  Supple, no jugular venous distention. No thyroid enlargement, no tenderness.  LUNGS: Decreased breath sounds bilaterally, no wheezing, rales,rhonchi or crepitation. No use of accessory muscles of respiration.  CARDIOVASCULAR: S1, S2 normal. No murmurs, rubs, or gallops.  ABDOMEN: Soft, non-tender, non-distended. Bowel sounds present. No organomegaly or mass.  EXTREMITIES: No pedal edema, cyanosis, or clubbing.  NEUROLOGIC: Cranial nerves II through XII are intact. Muscle strength 5/5 in all extremities. Sensation intact. Gait not checked.  PSYCHIATRIC: The patient is alert and oriented x 3.  SKIN: No obvious rash, lesion, or ulcer.   DATA REVIEW:   CBC  Recent Labs Lab 01/01/17 0522  WBC 13.6*  HGB 8.6*  HCT 27.1*  PLT 306    Chemistries   Recent Labs Lab 12/31/16 1637 01/01/17 0522  NA 128* 134*  K 3.8 4.4  CL 93* 97*  CO2 27 31  GLUCOSE 282* 299*  BUN 29* 18  CREATININE 0.94  0.78  CALCIUM 8.3* 8.7*  AST 46*  --   ALT 28  --   ALKPHOS 49  --   BILITOT 0.9  --     Microbiology Results  Results for orders placed or performed during the hospital encounter of 12/31/16  Culture, blood (routine x 2)     Status: None (Preliminary result)   Collection Time: 12/31/16  3:01 PM  Result Value Ref Range Status   Specimen Description BLOOD RIGHT HAND  Final   Special Requests BOTTLES DRAWN AEROBIC AND ANAEROBIC BCAV  Final   Culture NO GROWTH 2 DAYS  Final   Report Status PENDING  Incomplete  Culture, blood (routine x 2)     Status: None (Preliminary result)   Collection Time: 12/31/16  3:01 PM  Result Value Ref Range Status   Specimen Description BLOOD LEFT ASSIST CONTROL  Final   Special Requests BOTTLES DRAWN AEROBIC AND ANAEROBIC BCAV  Final   Culture NO GROWTH 2 DAYS  Final   Report Status PENDING  Incomplete     Management plans discussed with the  patient, family and they are in agreement.  CODE STATUS:  Code Status History    Date Active Date Inactive Code Status Order ID Comments User Context   12/31/2016  6:35 PM 01/02/2017  3:10 PM Full Code [id number]  Epifanio Lesches, MD ED   08/15/2016  9:25 AM 08/15/2016  5:23 PM Full Code [id number]  Katha Cabal, MD Inpatient    Advance Directive Documentation   Flowsheet Row Most Recent Value  Type of Advance Directive  Healthcare Power of Attorney  Pre-existing out of facility DNR order (yellow form or pink MOST form)  No data  "MOST" Form in Place?  No data      TOTAL TIME TAKING CARE OF THIS PATIENT: 35 minutes.    Loletha Grayer M.D on 01/02/2017 at 4:47 PM  Between 7am to 6pm - Pager - [phone]  After 6pm go to [email or web address] - password [email or web address]  [phone]  CC: Primary care physician; Ezequiel Kayser, MD

## 2017-01-02 NOTE — Discharge Instructions (Signed)
Community-Acquired Pneumonia, Adult Introduction Pneumonia is an infection of the lungs. One type of pneumonia can happen while a person is in a hospital. A different type can happen when a person is not in a hospital (community-acquired pneumonia). It is easy for this kind to spread from person to person. It can spread to you if you breathe near an infected person who coughs or sneezes. Some symptoms include:  A dry cough.  A wet (productive) cough.  Fever.  Sweating.  Chest pain. Follow these instructions at home:  Take over-the-counter and prescription medicines only as told by your doctor.  Only take cough medicine if you are losing sleep.  If you were prescribed an antibiotic medicine, take it as told by your doctor. Do not stop taking the antibiotic even if you start to feel better.  Sleep with your head and neck raised (elevated). You can do this by putting a few pillows under your head, or you can sleep in a recliner.  Do not use tobacco products. These include cigarettes, chewing tobacco, and e-cigarettes. If you need help quitting, ask your doctor.  Drink enough water to keep your pee (urine) clear or pale yellow. A shot (vaccine) can help prevent pneumonia. Shots are often suggested for:  People older than 74 years of age.  People older than 74 years of age:  Who are having cancer treatment.  Who have long-term (chronic) lung disease.  Who have problems with their body's defense system (immune system). You may also prevent pneumonia if you take these actions:  Get the flu (influenza) shot every year.  Go to the dentist as often as told.  Wash your hands often. If soap and water are not available, use hand sanitizer. Contact a doctor if:  You have a fever.  You lose sleep because your cough medicine does not help. Get help right away if:  You are short of breath and it gets worse.  You have more chest pain.  Your sickness gets worse. This is very  serious if:  You are an older adult.  Your body's defense system is weak.  You cough up blood. This information is not intended to replace advice given to you by your health care provider. Make sure you discuss any questions you have with your health care provider. Document Released: 04/17/2008 Document Revised: 04/06/2016 Document Reviewed: 02/24/2015  2017 Elsevier   Influenza, Adult Influenza (the flu") is an infection in the lungs, nose, and throat (respiratory tract). It is caused by a virus. The flu causes many common cold symptoms, as well as a high fever and body aches. It can make you feel very sick. The flu spreads easily from person to person (is contagious). Getting a flu shot (influenza vaccination) every year is the best way to prevent the flu. Follow these instructions at home:  Take over-the-counter and prescription medicines only as told by your doctor.  Use a cool mist humidifier to add moisture (humidity) to the air in your home. This can make it easier to breathe.  Rest as needed.  Drink enough fluid to keep your pee (urine) clear or pale yellow.  Cover your mouth and nose when you cough or sneeze.  Wash your hands with soap and water often, especially after you cough or sneeze. If you cannot use soap and water, use hand sanitizer.  Stay home from work or school as told by your doctor. Unless you are visiting your doctor, try to avoid leaving home until your fever  has been gone for 24 hours without the use of medicine.  Keep all follow-up visits as told by your doctor. This is important. How is this prevented?  Getting a yearly (annual) flu shot is the best way to avoid getting the flu. You may get the flu shot in late summer, fall, or winter. Ask your doctor when you should get your flu shot.  Wash your hands often or use hand sanitizer often.  Avoid contact with people who are sick during cold and flu season.  Eat healthy foods.  Drink plenty of  fluids.  Get enough sleep.  Exercise regularly. Contact a doctor if:  You get new symptoms.  You have:  Chest pain.  Watery poop (diarrhea).  A fever.  Your cough gets worse.  You start to have more mucus.  You feel sick to your stomach (nauseous).  You throw up (vomit). Get help right away if:  You start to be short of breath or have trouble breathing.  Your skin or nails turn a bluish color.  You have very bad pain or stiffness in your neck.  You get a sudden headache.  You get sudden pain in your face or ear.  You cannot stop throwing up. This information is not intended to replace advice given to you by your health care provider. Make sure you discuss any questions you have with your health care provider. Document Released: 08/08/2008 Document Revised: 04/06/2016 Document Reviewed: 08/24/2015 Elsevier Interactive Patient Education  2017 [employer].

## 2017-01-05 LAB — CULTURE, BLOOD (ROUTINE X 2)
Culture: NO GROWTH
Culture: NO GROWTH

## 2017-01-31 ENCOUNTER — Ambulatory Visit: Payer: Medicare HMO | Admitting: Hematology and Oncology

## 2017-02-21 ENCOUNTER — Ambulatory Visit: Payer: Medicare HMO

## 2017-02-21 ENCOUNTER — Ambulatory Visit
Admission: RE | Admit: 2017-02-21 | Discharge: 2017-02-21 | Disposition: A | Discharge status: Home / Self Care | Payer: Medicare HMO | Source: Ambulatory Visit | Attending: Hematology and Oncology | Admitting: Hematology and Oncology

## 2017-02-21 ENCOUNTER — Encounter: Payer: Self-pay | Admitting: Hematology and Oncology

## 2017-02-21 ENCOUNTER — Inpatient Hospital Stay: Payer: Medicare HMO | Attending: Hematology and Oncology | Admitting: Hematology and Oncology

## 2017-02-21 DIAGNOSIS — Z79899 Other long term (current) drug therapy: Secondary | ICD-10-CM | POA: Insufficient documentation

## 2017-02-21 DIAGNOSIS — Z7982 Long term (current) use of aspirin: Secondary | ICD-10-CM | POA: Diagnosis not present

## 2017-02-21 DIAGNOSIS — M79651 Pain in right thigh: Secondary | ICD-10-CM | POA: Insufficient documentation

## 2017-02-21 DIAGNOSIS — K219 Gastro-esophageal reflux disease without esophagitis: Secondary | ICD-10-CM

## 2017-02-21 DIAGNOSIS — I10 Essential (primary) hypertension: Secondary | ICD-10-CM | POA: Diagnosis not present

## 2017-02-21 DIAGNOSIS — Z87891 Personal history of nicotine dependence: Secondary | ICD-10-CM | POA: Diagnosis not present

## 2017-02-21 DIAGNOSIS — E119 Type 2 diabetes mellitus without complications: Secondary | ICD-10-CM | POA: Diagnosis not present

## 2017-02-21 DIAGNOSIS — Z7984 Long term (current) use of oral hypoglycemic drugs: Secondary | ICD-10-CM | POA: Insufficient documentation

## 2017-02-21 DIAGNOSIS — D472 Monoclonal gammopathy: Secondary | ICD-10-CM | POA: Insufficient documentation

## 2017-02-21 DIAGNOSIS — M5030 Other cervical disc degeneration, unspecified cervical region: Secondary | ICD-10-CM | POA: Insufficient documentation

## 2017-02-21 DIAGNOSIS — D649 Anemia, unspecified: Secondary | ICD-10-CM | POA: Insufficient documentation

## 2017-02-21 DIAGNOSIS — I700 Atherosclerosis of aorta: Secondary | ICD-10-CM | POA: Diagnosis not present

## 2017-02-21 DIAGNOSIS — M852 Hyperostosis of skull: Secondary | ICD-10-CM | POA: Insufficient documentation

## 2017-02-21 DIAGNOSIS — M1990 Unspecified osteoarthritis, unspecified site: Secondary | ICD-10-CM | POA: Diagnosis not present

## 2017-02-21 LAB — CBC WITH DIFFERENTIAL/PLATELET
Basophils Absolute: 0 10*3/uL (ref 0–0.1)
Basophils Relative: 0 %
Eosinophils Absolute: 0.1 10*3/uL (ref 0–0.7)
Eosinophils Relative: 2 %
HCT: 26 % — ABNORMAL LOW (ref 40.0–52.0)
Hemoglobin: 8.2 g/dL — ABNORMAL LOW (ref 13.0–18.0)
Lymphocytes Relative: 26 %
Lymphs Abs: 1.7 10*3/uL (ref 1.0–3.6)
MCH: 25.6 pg — ABNORMAL LOW (ref 26.0–34.0)
MCHC: 31.5 g/dL — ABNORMAL LOW (ref 32.0–36.0)
MCV: 81.2 fL (ref 80.0–100.0)
Monocytes Absolute: 0.5 10*3/uL (ref 0.2–1.0)
Monocytes Relative: 8 %
Neutro Abs: 4.4 10*3/uL (ref 1.4–6.5)
Neutrophils Relative %: 64 %
Platelets: 357 10*3/uL (ref 150–440)
RBC: 3.2 MIL/uL — ABNORMAL LOW (ref 4.40–5.90)
RDW: 18.7 % — ABNORMAL HIGH (ref 11.5–14.5)
WBC: 6.8 10*3/uL (ref 3.8–10.6)

## 2017-02-21 LAB — COMPREHENSIVE METABOLIC PANEL
ALT: 11 U/L — ABNORMAL LOW (ref 17–63)
AST: 16 U/L (ref 15–41)
Albumin: 3.1 g/dL — ABNORMAL LOW (ref 3.5–5.0)
Alkaline Phosphatase: 70 U/L (ref 38–126)
Anion gap: 7 (ref 5–15)
BUN: 9 mg/dL (ref 6–20)
CO2: 30 mmol/L (ref 22–32)
Calcium: 8.8 mg/dL — ABNORMAL LOW (ref 8.9–10.3)
Chloride: 97 mmol/L — ABNORMAL LOW (ref 101–111)
Creatinine, Ser: 0.59 mg/dL — ABNORMAL LOW (ref 0.61–1.24)
GFR calc Af Amer: >60 mL/min (ref >60)
GFR calc non Af Amer: >60 mL/min (ref >60)
Glucose, Bld: 93 mg/dL (ref 65–99)
Potassium: 3.5 mmol/L (ref 3.5–5.1)
Sodium: 134 mmol/L — ABNORMAL LOW (ref 135–145)
Total Bilirubin: 0.4 mg/dL (ref 0.3–1.2)
Total Protein: 6.9 g/dL (ref 6.5–8.1)

## 2017-02-21 LAB — SEDIMENTATION RATE: Sed Rate: 119 mm/hr — ABNORMAL HIGH (ref 0–20)

## 2017-02-21 LAB — FERRITIN: Ferritin: 372 ng/mL — ABNORMAL HIGH (ref 24–336)

## 2017-02-21 LAB — IRON AND TIBC
Iron: 16 ug/dL — ABNORMAL LOW (ref 45–182)
Saturation Ratios: 7 % — ABNORMAL LOW (ref 17.9–39.5)
TIBC: 217 ug/dL — ABNORMAL LOW (ref 250–450)
UIBC: 201 ug/dL

## 2017-02-21 LAB — FOLATE: Folate: 12.3 ng/mL (ref >5.9)

## 2017-02-21 LAB — RETICULOCYTES
RBC.: 3.34 MIL/uL — ABNORMAL LOW (ref 4.40–5.90)
Retic Count, Absolute: 36.7 10*3/uL (ref 19.0–183.0)
Retic Ct Pct: 1.1 % (ref 0.4–3.1)

## 2017-02-21 LAB — DAT, POLYSPECIFIC AHG (ARMC ONLY): Polyspecific AHG test: NEGATIVE

## 2017-02-21 NOTE — Progress Notes (Signed)
[facility] day:  02/21/2017  Chief Complaint: Victor Dixon is a 76 y.o. male with anemia and an M-spike who is referred by Dr. Ezequiel Kayser for assessment and management.  HPI:  The patient states that he has had anemia for some time.  He states that his labs are checked every 6 months.  He has been on oral B12 and iron for 1 year.  Regarding his diet, he "watches what I eat".  He eats a lot of baked chicken.  He eats meat every other day.  He eats green leafy vegetables.  He craves cake.  He denies ice pica.  His last colonoscopy was > 5 years ago.  He denies any history of gastric ulcer.  He denies any melena or hematochezia.  Labs reveal a progressive normocytic anemia since 10/19/2016.  Hematocrit has decreased from 37.9 to 28.5 with a hemoglobin decreasing from 11.7 to 8.4.  Platelet count was 261,000 on 10/20/2015 and 532,000 on 01/26/2017.  Labs on 01/26/2017 revealed a ferritin of 504.  B12 was 665.  TIBC was 244 (low).  Reticulocyte count was 2.96%.  RA latex was < 10.0.  CRP was 63.6 (high).  SPEP revealed a 0.6 gm/dL monoclonal spike.  Symptomatically, he notes a weight loss of 40-45 pounds since 01-29-2017 when his wife died.  His appetite is poor.  He denies depression.  He denies any bone pain, but notes that his right thigh bothers him sometimes.  He notes edema in his right hand.  He denies any issues with infections.   Past Medical History:  Diagnosis Date  . Anemia   . Diabetes mellitus without complication ([place])   . GERD (gastroesophageal reflux disease)   . Hypertension     Past Surgical History:  Procedure Laterality Date  . APPENDECTOMY    . CAROTID ARTERY ANGIOPLASTY Right   . PERIPHERAL VASCULAR CATHETERIZATION Left 08/15/2016   Procedure: Lower Extremity Angiography;  Surgeon: Katha Cabal, MD;  Location: [facility];  Service: Cardiovascular;  Laterality: Left;  . PERIPHERAL VASCULAR CATHETERIZATION   08/15/2016   Procedure: Lower Extremity Intervention;  Surgeon: Katha Cabal, MD;  Location: [facility];  Service: Cardiovascular;;    Family History  Problem Relation Age of Onset  . Cancer Sister     Social History:  reports that he quit smoking about 10 years ago. He has a 15.00 pack-year smoking history. He has never used smokeless tobacco. He reports that he does not drink alcohol or use drugs.  He smoked a 1/2 pack a day since age 40.  He stopped smoking 10 years ago.  The patient's wife died in 01-29-2017.  The patient is alone today.  Allergies: No Known Allergies  Current Medications: Current Outpatient Prescriptions  Medication Sig Dispense Refill  . aspirin EC 81 MG tablet Take 81 mg by mouth daily.    . carvedilol (COREG) 6.25 MG tablet Take 6.25 mg by mouth 2 (two) times daily with a meal.    . Cyanocobalamin (VITAMIN B 12) 250 MCG LOZG Take 500 mcg by mouth every morning.    . finasteride (PROSCAR) 5 MG tablet Take 5 mg by mouth daily.    Marland Kitchen gabapentin (NEURONTIN) 300 MG capsule Take 300 mg by mouth 3 (three) times daily.    Marland Kitchen losartan-hydrochlorothiazide (HYZAAR) 100-25 MG tablet Take 100 tablets by mouth daily.    . metFORMIN (GLUCOPHAGE-XR) 500 MG 24 hr tablet Take 500 mg  by mouth 2 (two) times daily with a meal.    . pantoprazole (PROTONIX) 40 MG tablet Take 40 mg by mouth daily.    . sennosides-docusate sodium (SENOKOT-S) 8.6-50 MG tablet Take 1 tablet by mouth daily.    . simvastatin (ZOCOR) 40 MG tablet Take 40 mg by mouth daily.    . tamsulosin (FLOMAX) 0.4 MG CAPS capsule Take 0.4 mg by mouth daily.    Marland Kitchen albuterol (PROVENTIL HFA;VENTOLIN HFA) 108 (90 Base) MCG/ACT inhaler Inhale 2 puffs into the lungs every 6 (six) hours as needed for wheezing or shortness of breath. (Patient not taking: Reported on 02/21/2017) 1 Inhaler 0  . azithromycin (ZITHROMAX) 250 MG tablet One tab daily for three days (Patient not taking: Reported on 02/21/2017) 6 each 0  .  cefUROXime (CEFTIN) 500 MG tablet Take 1 tablet (500 mg total) by mouth 2 (two) times daily with a meal. (Patient not taking: Reported on 02/21/2017) 7 tablet 0  . cilostazol (PLETAL) 100 MG tablet Take 100 mg by mouth 2 (two) times daily.    . ferrous sulfate 325 (65 FE) MG tablet Take 325 mg by mouth daily with breakfast.    . glimepiride (AMARYL) 2 MG tablet Take 2 mg by mouth daily with breakfast.    . oseltamivir (TAMIFLU) 75 MG capsule Take 1 capsule (75 mg total) by mouth 2 (two) times daily. (Patient not taking: Reported on 02/21/2017) 6 capsule 0  . predniSONE (DELTASONE) 5 MG tablet 4 tabs po day1; 3 tabs po day2; 2 tabs po day3; 1 tab po day4 (Patient not taking: Reported on 02/21/2017) 10 tablet 0   No current facility-administered medications for this visit.     Review of Systems:  GENERAL:  Fatigue.  No fevers or sweats.  Weight loss of 40-45 pounds since 12/2016. PERFORMANCE STATUS (ECOG):  1 HEENT:  Runny nose.  No visual changes, sore throat, mouth sores or tenderness. Lungs: No shortness of breath or cough.  No hemoptysis. Cardiac:  No chest pain, palpitations, orthopnea, or PND. GI:  Appetite poor.  No nausea, vomiting, diarrhea, constipation, melena or hematochezia. GU:  No urgency, frequency, dysuria, or hematuria.  Nocturia (chronic). Musculoskeletal:  Intermittent right thigh soreness.  Can't make a fist secondary to sore hands.  No back pain.  No muscle tenderness. Extremities:  Hand swelling.  No pain. Skin:  No rashes or skin changes. Neuro:  No headache, numbness or weakness, balance or coordination issues. Endocrine:  Diabetes.  No thyroid issues, hot flashes or night sweats. Psych:  No mood changes, depression or anxiety. Pain:  No focal pain. Review of systems:  All other systems reviewed and found to be negative.  Physical Exam: Blood pressure 110/63, pulse (!) 102, temperature 97.2 F (36.2 C), temperature source Tympanic, resp. rate 18, height '6\' 1"'  (1.854  m), weight 206 lb 5.6 oz (93.6 kg). GENERAL:  Well developed, well nourished, gentleman sitting comfortably in the exam room in no acute distress. MENTAL STATUS:  Alert and oriented to person, place and time. HEAD:  Wearing a golf cap. Short hair.  Normocephalic, atraumatic, face symmetric, no Cushingoid features. EYES:  Glasses.  Brown eyes.  Pupils equal round and reactive to light and accomodation.  No conjunctivitis or scleral icterus. ENT:  Oropharynx clear without lesion.  Tongue normal.  Dentures.  Mucous membranes moist.  RESPIRATORY:  Clear to auscultation without rales, wheezes or rhonchi. CARDIOVASCULAR:  Regular rate and rhythm without murmur, rub or gallop. ABDOMEN:  Soft, non-tender,  with active bowel sounds, and no hepatosplenomegaly.  No masses. SKIN:  No rashes, ulcers or lesions. EXTREMITIES: Ankle edema (left > right).  Mild edema right hand.  Left wrist tender.  No skin discoloration or tenderness.  No palpable cords. LYMPH NODES: No palpable cervical, supraclavicular, axillary or inguinal adenopathy  NEUROLOGICAL: Unremarkable. PSYCH:  Appropriate.   No visits with results within 3 Day(s) from this visit.  Latest known visit with results is:  Admission on 12/31/2016, Discharged on 01/02/2017  Component Date Value Ref Range Status  . Glucose-Capillary 12/31/2016 305* 65 - 99 mg/dL Final  . Specimen Description 12/31/2016 BLOOD RIGHT HAND   Final  . Special Requests 12/31/2016 BOTTLES DRAWN AEROBIC AND ANAEROBIC BCAV   Final  . Culture 12/31/2016 NO GROWTH 5 DAYS   Final  . Report Status 12/31/2016 01/05/2017 FINAL   Final  . Specimen Description 12/31/2016 BLOOD LEFT ASSIST CONTROL   Final  . Special Requests 12/31/2016 BOTTLES DRAWN AEROBIC AND ANAEROBIC BCAV   Final  . Culture 12/31/2016 NO GROWTH 5 DAYS   Final  . Report Status 12/31/2016 01/05/2017 FINAL   Final  . WBC 12/31/2016 7.6  3.8 - 10.6 K/uL Final  . RBC 12/31/2016 3.11* 4.40 - 5.90 MIL/uL Final  .  Hemoglobin 12/31/2016 8.2* 13.0 - 18.0 g/dL Final  . HCT 12/31/2016 25.2* 40.0 - 52.0 % Final  . MCV 12/31/2016 81.1  80.0 - 100.0 fL Final  . MCH 12/31/2016 26.3  26.0 - 34.0 pg Final  . MCHC 12/31/2016 32.4  32.0 - 36.0 g/dL Final  . RDW 12/31/2016 14.8* 11.5 - 14.5 % Final  . Platelets 12/31/2016 250  150 - 440 K/uL Final  . Neutrophils Relative % 12/31/2016 94  % Final  . Neutro Abs 12/31/2016 7.1* 1.4 - 6.5 K/uL Final  . Lymphocytes Relative 12/31/2016 2  % Final  . Lymphs Abs 12/31/2016 0.2* 1.0 - 3.6 K/uL Final  . Monocytes Relative 12/31/2016 4  % Final  . Monocytes Absolute 12/31/2016 0.3  0.2 - 1.0 K/uL Final  . Eosinophils Relative 12/31/2016 0  % Final  . Eosinophils Absolute 12/31/2016 0.0  0 - 0.7 K/uL Final  . Basophils Relative 12/31/2016 0  % Final  . Basophils Absolute 12/31/2016 0.0  0 - 0.1 K/uL Final  . Color, Urine 12/31/2016 AMBER* YELLOW Final  . APPearance 12/31/2016 HAZY* CLEAR Final  . Specific Gravity, Urine 12/31/2016 1.023  1.005 - 1.030 Final  . pH 12/31/2016 5.0  5.0 - 8.0 Final  . Glucose, UA 12/31/2016 50* NEGATIVE mg/dL Final  . Hgb urine dipstick 12/31/2016 MODERATE* NEGATIVE Final  . Bilirubin Urine 12/31/2016 NEGATIVE  NEGATIVE Final  . Ketones, ur 12/31/2016 NEGATIVE  NEGATIVE mg/dL Final  . Protein, ur 12/31/2016 >=300* NEGATIVE mg/dL Final  . Nitrite 12/31/2016 NEGATIVE  NEGATIVE Final  . Leukocytes, UA 12/31/2016 NEGATIVE  NEGATIVE Final  . RBC / HPF 12/31/2016 0-5  0 - 5 RBC/hpf Final  . WBC, UA 12/31/2016 6-30  0 - 5 WBC/hpf Final  . Bacteria, UA 12/31/2016 NONE SEEN  NONE SEEN Final  . Squamous Epithelial / LPF 12/31/2016 0-5* NONE SEEN Final  . Mucous 12/31/2016 PRESENT   Final  . Hyaline Casts, UA 12/31/2016 PRESENT   Final  . Granular Casts, UA 12/31/2016 PRESENT   Final  . Influenza A By PCR 12/31/2016 POSITIVE* NEGATIVE Final  . Influenza B By PCR 12/31/2016 NEGATIVE  NEGATIVE Final   Comment: (NOTE) The Xpert Xpress Flu assay is  intended as an aid in the diagnosis of  influenza and should not be used as a sole basis for treatment.  This  assay is FDA approved for nasopharyngeal swab specimens only. Nasal  washings and aspirates are unacceptable for Xpert Xpress Flu testing.   . Lactic Acid, Venous 12/31/2016 2.3* 0.5 - 1.9 mmol/L Final   Comment: CRITICAL RESULT CALLED TO, READ BACK BY AND VERIFIED WITH DENIA ROYSTER RN AT 5329 12/31/16 MSS.   . Lactic Acid, Venous 12/31/2016 1.4  0.5 - 1.9 mmol/L Final  . Sodium 12/31/2016 128* 135 - 145 mmol/L Final  . Potassium 12/31/2016 3.8  3.5 - 5.1 mmol/L Final  . Chloride 12/31/2016 93* 101 - 111 mmol/L Final  . CO2 12/31/2016 27  22 - 32 mmol/L Final  . Glucose, Bld 12/31/2016 282* 65 - 99 mg/dL Final  . BUN 12/31/2016 29* 6 - 20 mg/dL Final  . Creatinine, Ser 12/31/2016 0.94  0.61 - 1.24 mg/dL Final  . Calcium 12/31/2016 8.3* 8.9 - 10.3 mg/dL Final  . Total Protein 12/31/2016 6.7  6.5 - 8.1 g/dL Final  . Albumin 12/31/2016 2.9* 3.5 - 5.0 g/dL Final  . AST 12/31/2016 46* 15 - 41 U/L Final  . ALT 12/31/2016 28  17 - 63 U/L Final  . Alkaline Phosphatase 12/31/2016 49  38 - 126 U/L Final  . Total Bilirubin 12/31/2016 0.9  0.3 - 1.2 mg/dL Final  . GFR calc non Af Amer 12/31/2016 >60  >60 mL/min Final  . GFR calc Af Amer 12/31/2016 >60  >60 mL/min Final   Comment: (NOTE) The eGFR has been calculated using the CKD EPI equation. This calculation has not been validated in all clinical situations. eGFR's persistently <60 mL/min signify possible Chronic Kidney Disease.   . Anion gap 12/31/2016 8  5 - 15 Final  . Sodium 01/01/2017 134* 135 - 145 mmol/L Final  . Potassium 01/01/2017 4.4  3.5 - 5.1 mmol/L Final  . Chloride 01/01/2017 97* 101 - 111 mmol/L Final  . CO2 01/01/2017 31  22 - 32 mmol/L Final  . Glucose, Bld 01/01/2017 299* 65 - 99 mg/dL Final  . BUN 01/01/2017 18  6 - 20 mg/dL Final  . Creatinine, Ser 01/01/2017 0.78  0.61 - 1.24 mg/dL Final  . Calcium  01/01/2017 8.7* 8.9 - 10.3 mg/dL Final  . GFR calc non Af Amer 01/01/2017 >60  >60 mL/min Final  . GFR calc Af Amer 01/01/2017 >60  >60 mL/min Final   Comment: (NOTE) The eGFR has been calculated using the CKD EPI equation. This calculation has not been validated in all clinical situations. eGFR's persistently <60 mL/min signify possible Chronic Kidney Disease.   . Anion gap 01/01/2017 6  5 - 15 Final  . WBC 01/01/2017 13.6* 3.8 - 10.6 K/uL Final  . RBC 01/01/2017 3.32* 4.40 - 5.90 MIL/uL Final  . Hemoglobin 01/01/2017 8.6* 13.0 - 18.0 g/dL Final  . HCT 01/01/2017 27.1* 40.0 - 52.0 % Final  . MCV 01/01/2017 81.6  80.0 - 100.0 fL Final  . MCH 01/01/2017 26.0  26.0 - 34.0 pg Final  . MCHC 01/01/2017 31.9* 32.0 - 36.0 g/dL Final  . RDW 01/01/2017 15.0* 11.5 - 14.5 % Final  . Platelets 01/01/2017 306  150 - 440 K/uL Final  . Procalcitonin 12/31/2016 3.71  ng/mL Final   Comment:        Interpretation: PCT > 2 ng/mL: Systemic infection (sepsis) is likely, unless other causes are known. (NOTE)  ICU PCT Algorithm               Non ICU PCT Algorithm    ----------------------------     ------------------------------         PCT < 0.25 ng/mL                 PCT < 0.1 ng/mL     Stopping of antibiotics            Stopping of antibiotics       strongly encouraged.               strongly encouraged.    ----------------------------     ------------------------------       PCT level decrease by               PCT < 0.25 ng/mL       >= 80% from peak PCT       OR PCT 0.25 - 0.5 ng/mL          Stopping of antibiotics                                             encouraged.     Stopping of antibiotics           encouraged.    ----------------------------     ------------------------------       PCT level decrease by              PCT >= 0.25 ng/mL       < 80% from peak PCT        AND PCT >= 0.5 ng/mL            Continuing antibiotics                                                                         encouraged.       Continuing antibiotics            encouraged.    ----------------------------     ------------------------------     PCT level increase compared          PCT > 0.5 ng/mL         with peak PCT AND          PCT >= 0.5 ng/mL             Escalation of antibiotics                                          strongly encouraged.      Escalation of antibiotics        strongly encouraged.   . Glucose-Capillary 01/01/2017 278* 65 - 99 mg/dL Final  . Glucose-Capillary 01/01/2017 268* 65 - 99 mg/dL Final  . Comment 1 01/01/2017 Notify RN   Final  . Procalcitonin 01/02/2017 4.33  ng/mL Final   Comment:        Interpretation: PCT > 2 ng/mL: Systemic infection (sepsis) is likely, unless other causes are  known. (NOTE)         ICU PCT Algorithm               Non ICU PCT Algorithm    ----------------------------     ------------------------------         PCT < 0.25 ng/mL                 PCT < 0.1 ng/mL     Stopping of antibiotics            Stopping of antibiotics       strongly encouraged.               strongly encouraged.    ----------------------------     ------------------------------       PCT level decrease by               PCT < 0.25 ng/mL       >= 80% from peak PCT       OR PCT 0.25 - 0.5 ng/mL          Stopping of antibiotics                                             encouraged.     Stopping of antibiotics           encouraged.    ----------------------------     ------------------------------       PCT level decrease by              PCT >= 0.25 ng/mL       < 80% from peak PCT        AND PCT >= 0.5 ng/mL            Continuing antibiotics                                                                        encouraged.       Continuing antibiotics            encouraged.    ----------------------------     ------------------------------     PCT level increase compared          PCT > 0.5 ng/mL         with peak PCT AND          PCT >= 0.5 ng/mL              Escalation of antibiotics                                          strongly encouraged.      Escalation of antibiotics        strongly encouraged.   . Glucose-Capillary 01/01/2017 185* 65 - 99 mg/dL Final  . Glucose-Capillary 01/01/2017 232* 65 - 99 mg/dL Final  . Comment 1 01/01/2017 Notify RN   Final  . Glucose-Capillary 01/02/2017 183* 65 - 99 mg/dL Final  . Glucose-Capillary 01/02/2017 146* 65 - 99 mg/dL Final  Assessment:  Victor Dixon is a 76 y.o. male with a progressive normocytic anemia since 10/19/2016.  Labs reveal a progressive normocytic anemia since 10/19/2016.  Hematocrit has decreased from 37.9 to 28.5 (hemoglobin 11.7 to 8.4).  He has been on oral iron and B12 x 1 year.  SPEP revealed a 0.6 gm/dL monoclonal spike.  Diet is good.  His last colonoscopy was > 5 years ago.  He denies any history of gastric ulcer.  He denies any melena or hematochezia.  Labs on 01/26/2017 revealed a normal B12, RF.  Ferritin was elevated (acute phase reactant). TIBC was 244 (low) c/w anemia of chronic disease.  Reticulocyte count was 2.96%.  CRP was 63.6 (high).   Symptomatically, he notes a weight loss of 40-45 pounds since 01-19-2017 when his wife died.  His appetite is poor.  He has intermittent right thigh discomfort.  He denies any issues with infections.  Exam reveals mild right hand edema and a tender left wrist.  Plan: 1.  Discuss differential diagnosis of anemia.  Discuss concern for potential myeloma with an associated M-Spike.  Work-up discussed in detail. 2.  Labs today:  CBC with diff, CMP, retic, ESR, ferritin, iron studies, folate, SPEP, immunoglobulins, free light chain assay, ANC with reflex, DAT. 3.  Collect 24 hour urine for UPEP. 4.  Bone survey. 5.  RTC in 1 week for MD assessment and review of labs.   Lequita Asal, MD  02/21/2017, 2:39 PM

## 2017-02-21 NOTE — Progress Notes (Signed)
Patient here today as new evaluation regarding anemia.  Referred by Dr. Wallie Renshaw.  Patient complains of his hands swelling.  States it is hard to drive his car or prepare his meals.  Patient recently lost his wife and is now taking care of his own meals, etc.   Also having same type pain in shoulders making it hard to move his arms to put on his clothes.

## 2017-02-22 LAB — KAPPA/LAMBDA LIGHT CHAINS
Kappa free light chain: 18.1 mg/L (ref 3.3–19.4)
Kappa, lambda light chain ratio: 1.27 (ref 0.26–1.65)
Lambda free light chains: 14.2 mg/L (ref 5.7–26.3)

## 2017-02-22 LAB — ANA W/REFLEX IF POSITIVE: Anti Nuclear Antibody(ANA): NEGATIVE

## 2017-02-23 LAB — MULTIPLE MYELOMA PANEL, SERUM
Albumin SerPl Elph-Mcnc: 3 g/dL (ref 2.9–4.4)
Albumin/Glob SerPl: 1 (ref 0.7–1.7)
Alpha 1: 0.3 g/dL (ref 0.0–0.4)
Alpha2 Glob SerPl Elph-Mcnc: 1 g/dL (ref 0.4–1.0)
B-Globulin SerPl Elph-Mcnc: 1 g/dL (ref 0.7–1.3)
Gamma Glob SerPl Elph-Mcnc: 0.9 g/dL (ref 0.4–1.8)
Globulin, Total: 3.2 g/dL (ref 2.2–3.9)
IgA: 158 mg/dL (ref 61–437)
IgG (Immunoglobin G), Serum: 832 mg/dL (ref 700–1600)
IgM, Serum: 56 mg/dL (ref 15–143)
M Protein SerPl Elph-Mcnc: 0.3 g/dL — ABNORMAL HIGH
Total Protein ELP: 6.2 g/dL (ref 6.0–8.5)

## 2017-02-26 ENCOUNTER — Inpatient Hospital Stay: Payer: Medicare HMO

## 2017-02-26 DIAGNOSIS — D649 Anemia, unspecified: Secondary | ICD-10-CM | POA: Diagnosis not present

## 2017-02-27 DIAGNOSIS — D649 Anemia, unspecified: Secondary | ICD-10-CM | POA: Diagnosis not present

## 2017-02-28 ENCOUNTER — Inpatient Hospital Stay (HOSPITAL_BASED_OUTPATIENT_CLINIC_OR_DEPARTMENT_OTHER): Payer: Medicare HMO | Admitting: Hematology and Oncology

## 2017-02-28 ENCOUNTER — Encounter: Payer: Self-pay | Admitting: Hematology and Oncology

## 2017-02-28 ENCOUNTER — Inpatient Hospital Stay: Payer: Medicare HMO

## 2017-02-28 VITALS — BP 132/80 | HR 109 | Temp 97.0°F | Resp 18 | Wt 205.2 lb

## 2017-02-28 DIAGNOSIS — I700 Atherosclerosis of aorta: Secondary | ICD-10-CM

## 2017-02-28 DIAGNOSIS — M79651 Pain in right thigh: Secondary | ICD-10-CM | POA: Diagnosis not present

## 2017-02-28 DIAGNOSIS — M1990 Unspecified osteoarthritis, unspecified site: Secondary | ICD-10-CM | POA: Diagnosis not present

## 2017-02-28 DIAGNOSIS — M852 Hyperostosis of skull: Secondary | ICD-10-CM | POA: Diagnosis not present

## 2017-02-28 DIAGNOSIS — E119 Type 2 diabetes mellitus without complications: Secondary | ICD-10-CM | POA: Diagnosis not present

## 2017-02-28 DIAGNOSIS — D472 Monoclonal gammopathy: Secondary | ICD-10-CM | POA: Diagnosis not present

## 2017-02-28 DIAGNOSIS — I10 Essential (primary) hypertension: Secondary | ICD-10-CM

## 2017-02-28 DIAGNOSIS — K219 Gastro-esophageal reflux disease without esophagitis: Secondary | ICD-10-CM

## 2017-02-28 DIAGNOSIS — Z79899 Other long term (current) drug therapy: Secondary | ICD-10-CM | POA: Diagnosis not present

## 2017-02-28 DIAGNOSIS — D649 Anemia, unspecified: Secondary | ICD-10-CM

## 2017-02-28 DIAGNOSIS — Z7984 Long term (current) use of oral hypoglycemic drugs: Secondary | ICD-10-CM

## 2017-02-28 DIAGNOSIS — Z7982 Long term (current) use of aspirin: Secondary | ICD-10-CM | POA: Diagnosis not present

## 2017-02-28 DIAGNOSIS — Z87891 Personal history of nicotine dependence: Secondary | ICD-10-CM

## 2017-02-28 LAB — OCCULT BLOOD X 1 CARD TO LAB, STOOL
Fecal Occult Bld: NEGATIVE
Fecal Occult Bld: NEGATIVE
Fecal Occult Bld: NEGATIVE

## 2017-02-28 NOTE — Progress Notes (Signed)
[facility] day:  02/28/2017  Chief Complaint: Victor Dixon is a 76 y.o. male with anemia and an M-spike who is seen for review of work-up and discussion regarding direction of therapy.  HPI:  The patient was last seen in the medical oncology clinic on 02/21/2017.  A that time, he was seen for initial consultation.  He had progressive anemia over the past 16 months.  He had been on oral iron and B12 x 1 year.  He also had a small M-spike.  He noted intermittent right thigh pain.  He denied any issues with infections.  He underwent a work-up.  CBC revealed a hematocrit of 26.0, hemoglobin 8.2, MCV 81.2, platelets 357,000, WBC 6800 with an ANC of 4400.  Retic was 1.1% (inappropriately low).  CMP revealed a creatinine of 0.59, calcium 8.8, protein 6.9, and albumen 3.1.  Coombs was negative.  Ferritin was 372.  Iron studies included a saturation of 7% and a TIBC of 217 (low).  Folate was 12.3.  ESR was 119.  SPEP revealed a 0.3 gm/dL IgG monoclonal protein with kappa light chain specificity.  Kappa free light chains were 18.1, lambda free light chains 14.2 with a ratio of 1.27.  Immunoglobulins were normal.  ANA was negative.  Bone survey on 02/22/2017 revealed no lytic or blastic bony lesions. There was hyperostosis frontalis interna.  There was thoracic and abdominal aortic atherosclerosis.  There was multilevel degenerative disc disease throughout the spine.  There was mild symmetric hip joint space narrowing bilaterally consistent with osteoarthritis.  Symptomatically, he notes that it is hard to turn his steering wheel on his truck as his hands are tender. He has joint issues.  He denies bone pain.  He is able to take care of his activities of daily living.   Past Medical History:  Diagnosis Date  . Anemia   . Diabetes mellitus without complication ([place])   . GERD (gastroesophageal reflux disease)   . Hypertension     Past Surgical History:   Procedure Laterality Date  . APPENDECTOMY    . CAROTID ARTERY ANGIOPLASTY Right   . PERIPHERAL VASCULAR CATHETERIZATION Left 08/15/2016   Procedure: Lower Extremity Angiography;  Surgeon: Katha Cabal, MD;  Location: [facility];  Service: Cardiovascular;  Laterality: Left;  . PERIPHERAL VASCULAR CATHETERIZATION  08/15/2016   Procedure: Lower Extremity Intervention;  Surgeon: Katha Cabal, MD;  Location: [facility];  Service: Cardiovascular;;    Family History  Problem Relation Age of Onset  . Cancer Sister     Social History:  reports that he quit smoking about 10 years ago. He has a 15.00 pack-year smoking history. He has never used smokeless tobacco. He reports that he does not drink alcohol or use drugs.  He smoked a 1/2 pack a day since age 67.  He stopped smoking 10 years ago.  The patient's wife died in 01-21-2017.  The patient is alone today.  Allergies: No Known Allergies  Current Medications: Current Outpatient Prescriptions  Medication Sig Dispense Refill  . aspirin EC 81 MG tablet Take 81 mg by mouth daily.    . carvedilol (COREG) 6.25 MG tablet Take 6.25 mg by mouth 2 (two) times daily with a meal.    . cilostazol (PLETAL) 100 MG tablet Take 100 mg by mouth 2 (two) times daily.    . Cyanocobalamin (VITAMIN B 12) 250 MCG LOZG Take 500 mcg by mouth every morning.    Marland Kitchen  ferrous sulfate 325 (65 FE) MG tablet Take 325 mg by mouth daily with breakfast.    . finasteride (PROSCAR) 5 MG tablet Take 5 mg by mouth daily.    Marland Kitchen gabapentin (NEURONTIN) 300 MG capsule Take 300 mg by mouth 3 (three) times daily.    Marland Kitchen glimepiride (AMARYL) 2 MG tablet Take 2 mg by mouth daily with breakfast.    . losartan-hydrochlorothiazide (HYZAAR) 100-25 MG tablet Take 100 tablets by mouth daily.    . metFORMIN (GLUCOPHAGE-XR) 500 MG 24 hr tablet Take 500 mg by mouth 2 (two) times daily with a meal.    . oseltamivir (TAMIFLU) 75 MG capsule Take 1 capsule (75 mg total) by mouth 2  (two) times daily. 6 capsule 0  . pantoprazole (PROTONIX) 40 MG tablet Take 40 mg by mouth daily.    . sennosides-docusate sodium (SENOKOT-S) 8.6-50 MG tablet Take 1 tablet by mouth daily.    . simvastatin (ZOCOR) 40 MG tablet Take 40 mg by mouth daily.    . tamsulosin (FLOMAX) 0.4 MG CAPS capsule Take 0.4 mg by mouth daily.    Marland Kitchen albuterol (PROVENTIL HFA;VENTOLIN HFA) 108 (90 Base) MCG/ACT inhaler Inhale 2 puffs into the lungs every 6 (six) hours as needed for wheezing or shortness of breath. (Patient not taking: Reported on 02/21/2017) 1 Inhaler 0  . azithromycin (ZITHROMAX) 250 MG tablet One tab daily for three days (Patient not taking: Reported on 02/21/2017) 6 each 0  . cefUROXime (CEFTIN) 500 MG tablet Take 1 tablet (500 mg total) by mouth 2 (two) times daily with a meal. (Patient not taking: Reported on 02/21/2017) 7 tablet 0  . predniSONE (DELTASONE) 5 MG tablet 4 tabs po day1; 3 tabs po day2; 2 tabs po day3; 1 tab po day4 (Patient not taking: Reported on 02/21/2017) 10 tablet 0   No current facility-administered medications for this visit.     Review of Systems:  GENERAL:  Fatigue.  No fevers or sweats.  Weight loss of 40-45 pounds since 12/2016.  Weight loss of 1 pound since last week. PERFORMANCE STATUS (ECOG):  1 HEENT:  No visual changes, sore throat, mouth sores or tenderness. Lungs: No shortness of breath or cough.  No hemoptysis. Cardiac:  No chest pain, palpitations, orthopnea, or PND. GI:  Appetite poor.  No nausea, vomiting, diarrhea, constipation, melena or hematochezia. GU:  No urgency, frequency, dysuria, or hematuria.  Nocturia (chronic). Musculoskeletal:  Intermittent right thigh soreness.  Can't make a fist secondary to sore hands (see HPI).  No back pain.  No muscle tenderness. Extremities:  Hand swelling.  No pain. Skin:  No rashes or skin changes. Neuro:  No headache, numbness or weakness, balance or coordination issues. Endocrine:  Diabetes.  No thyroid issues, hot  flashes or night sweats. Psych:  No mood changes, depression or anxiety. Pain:  No focal pain. Review of systems:  All other systems reviewed and found to be negative.  Physical Exam: Blood pressure 132/80, pulse (!) 109, temperature 97 F (36.1 C), temperature source Tympanic, resp. rate 18, weight 205 lb 4 oz (93.1 kg). GENERAL:  Well developed, well nourished, gentleman sitting comfortably in the exam room in no acute distress. MENTAL STATUS:  Alert and oriented to person, place and time. HEAD:  Wearing a black cap. Short hair.  Normocephalic, atraumatic, face symmetric, no Cushingoid features. EYES:  Glasses.  Brown eyes.  No conjunctivitis or scleral icterus. NEUROLOGICAL: Unremarkable. PSYCH:  Appropriate.   Appointment on 02/28/2017  Component Date Value  Ref Range Status  . Fecal Occult Bld 02/26/2017 NEGATIVE  NEGATIVE Final  . Fecal Occult Bld 02/27/2017 NEGATIVE  NEGATIVE Final  . Fecal Occult Bld 02/28/2017 NEGATIVE  NEGATIVE Final    Assessment:  Victor Dixon is a 76 y.o. male with a progressive normocytic anemia since 10/19/2016.  Labs reveal a progressive normocytic anemia since 10/19/2016.  Hematocrit has decreased from 37.9 to 28.5 (hemoglobin 11.7 to 8.4).  He has been on oral iron and B12 x 1 year.  SPEP revealed a 0.6 gm/dL monoclonal spike on 01/26/2017.  Diet is good.  His last colonoscopy was > 5 years ago.  He denies any history of gastric ulcer.  He denies any melena or hematochezia.  Labs on 01/26/2017 revealed a normal B12, RF.  Ferritin was elevated (acute phase reactant). TIBC was 244 (low) c/w anemia of chronic disease.  Reticulocyte count was 2.96%.  CRP was 63.6 (high).   Work-up on 02/21/2017 revealed a 0.3 gm/dL IgG monoclonal protein with kappa light chain specificity.  Free light chain ratio was 1.27 (normal).  Hematocrit was 26.0, hemoglobin 8.2, MCV 81.2, platelets 357,000, WBC 6800 with an ANC of 4400.  Retic was 1.1% (inappropriately low).   Normal studies included: creatinine, calcium, Coombs, folate, ANA, and immunoglobulins.  Ferritin was 372.  Iron studies included a saturation of 7% and a TIBC of 217 (low).  Sed rate was 119.  Bone survey on 02/22/2017 revealed no lytic or blastic bony lesions. There was multilevel degenerative disc disease throughout the spine.  There was mild symmetric hip joint space narrowing bilaterally consistent with osteoarthritis.  Symptomatically, he notes a weight loss of 40-45 pounds since 01/30/17 when his wife died.  His appetite is poor.  He has intermittent right thigh discomfort and significant pain in his hands due to arthritis.  He denies any infections.   Plan: 1.  Review work-up.  Testing confirms monoclonal gammopathy.  Creatinine and calcium normal.  No bone lesions seen on bone survey.  Await 24 hour UPEP.  Possible monoclonal gammopathy of unknown significance (MGUS), but because of significant progressive anemia, discuss plan for bone marrow aspirate and biopsy.  Procedure discussed in detail.  Several questions asked and answered. 2.  Schedule bone marrow aspirate and biopsy 3.  Plan to check TSH at next lab draw. 4.  RTC 10 days after bone marrow for MD review and discussion regarding direction of therapy.   Lequita Asal, MD  02/28/2017, 2:12 PM

## 2017-02-28 NOTE — Progress Notes (Signed)
Patient states his hands are tender, so much so that it is hard to drive his truck, prepare food and clothe himself.  States he had a shot to loosen his joint.

## 2017-03-01 LAB — IFE+PROTEIN ELECTRO, 24-HR UR
% BETA, Urine: 0 %
ALPHA 1 URINE: 0 %
Albumin, U: 100 %
Alpha 2, Urine: 0 %
GAMMA GLOBULIN URINE: 0 %
Total Protein, Urine-Ur/day: 265 mg/24 hr — ABNORMAL HIGH (ref 30–150)
Total Protein, Urine: 6.3 mg/dL
Total Volume: 4200

## 2017-03-05 ENCOUNTER — Telehealth: Payer: Self-pay | Admitting: *Deleted

## 2017-03-05 NOTE — Telephone Encounter (Signed)
appt for bone marrow procedure called to patient for May 2nd arrive at 0730 for 0830 procedure. Patient instructed to be npo after midnight and to have a driver, voiced understanding.

## 2017-03-13 ENCOUNTER — Other Ambulatory Visit: Payer: Self-pay | Admitting: Radiology

## 2017-03-13 MED ORDER — HEPARIN SOD (PORK) LOCK FLUSH 100 UNIT/ML IV SOLN
500.0000 [IU] | Freq: Once | INTRAVENOUS | Status: AC
  Filled 2017-03-13: qty 5

## 2017-03-13 MED ORDER — HEPARIN SOD (PORK) LOCK FLUSH 100 UNIT/ML IV SOLN: 500.0000 [IU] | Freq: Once | INTRAVENOUS | Status: DC

## 2017-03-14 ENCOUNTER — Ambulatory Visit: Admission: RE | Admit: 2017-03-14 | Payer: Medicare HMO | Source: Ambulatory Visit

## 2017-03-14 ENCOUNTER — Ambulatory Visit
Admission: RE | Admit: 2017-03-14 | Discharge: 2017-03-14 | Disposition: A | Discharge status: Home / Self Care | Payer: Medicare HMO | Source: Ambulatory Visit | Attending: Hematology and Oncology | Admitting: Hematology and Oncology

## 2017-03-14 ENCOUNTER — Other Ambulatory Visit (HOSPITAL_COMMUNITY)
Admission: RE | Admit: 2017-03-14 | Disposition: A | Discharge status: Home / Self Care | Payer: Medicare HMO | Source: Ambulatory Visit | Attending: Hematology and Oncology | Admitting: Hematology and Oncology

## 2017-03-14 DIAGNOSIS — D649 Anemia, unspecified: Secondary | ICD-10-CM | POA: Diagnosis present

## 2017-03-14 DIAGNOSIS — D472 Monoclonal gammopathy: Secondary | ICD-10-CM | POA: Insufficient documentation

## 2017-03-14 DIAGNOSIS — D509 Iron deficiency anemia, unspecified: Secondary | ICD-10-CM | POA: Insufficient documentation

## 2017-03-14 LAB — CBC WITH DIFFERENTIAL/PLATELET
BASOS PCT: 1 %
Basophils Absolute: 0 10*3/uL (ref 0–0.1)
Eosinophils Absolute: 0.1 10*3/uL (ref 0–0.7)
Eosinophils Relative: 2 %
HEMATOCRIT: 30.6 % — AB (ref 40.0–52.0)
HEMOGLOBIN: 9.4 g/dL — AB (ref 13.0–18.0)
LYMPHS ABS: 1.1 10*3/uL (ref 1.0–3.6)
Lymphocytes Relative: 13 %
MCH: 24.3 pg — AB (ref 26.0–34.0)
MCHC: 30.7 g/dL — AB (ref 32.0–36.0)
MCV: 79.3 fL — ABNORMAL LOW (ref 80.0–100.0)
MONO ABS: 0.7 10*3/uL (ref 0.2–1.0)
MONOS PCT: 8 %
NEUTROS ABS: 6.6 10*3/uL — AB (ref 1.4–6.5)
NEUTROS PCT: 76 %
Platelets: 408 10*3/uL (ref 150–440)
RBC: 3.86 MIL/uL — ABNORMAL LOW (ref 4.40–5.90)
RDW: 18.8 % — AB (ref 11.5–14.5)
WBC: 8.5 10*3/uL (ref 3.8–10.6)

## 2017-03-14 LAB — PROTIME-INR
INR: 0.96
Prothrombin Time: 12.8 seconds (ref 11.4–15.2)

## 2017-03-14 LAB — GLUCOSE, CAPILLARY: Glucose-Capillary: 144 mg/dL — ABNORMAL HIGH (ref 65–99)

## 2017-03-14 LAB — APTT: aPTT: 32 seconds (ref 24–36)

## 2017-03-14 MED ORDER — MIDAZOLAM HCL 2 MG/2ML IJ SOLN
INTRAMUSCULAR | Status: DC | PRN
  Administered 2017-03-14 (×2): 1 mg via INTRAVENOUS

## 2017-03-14 MED ORDER — FENTANYL CITRATE (PF) 100 MCG/2ML IJ SOLN
INTRAMUSCULAR | Status: DC | PRN
  Administered 2017-03-14 (×2): 50 ug via INTRAVENOUS

## 2017-03-14 MED ORDER — HEPARIN SOD (PORK) LOCK FLUSH 100 UNIT/ML IV SOLN
500.0000 [IU] | Freq: Once | INTRAVENOUS | Status: DC
  Filled 2017-03-14: qty 5

## 2017-03-14 MED ORDER — SODIUM CHLORIDE 0.9 % IV SOLN
INTRAVENOUS | Status: DC
  Administered 2017-03-14: 09:00:00 via INTRAVENOUS

## 2017-03-14 NOTE — H&P (Signed)
Chief Complaint: Patient was seen in consultation today for bone marrow biopsy at the request of [place] [facility]  Referring Physician(s): [facility]  Patient Status: [facility] - Out-pt  History of Present Illness: Victor Dixon is a 76 y.o. male with monoclonal gammopathy and anemia requiring bone marrow biopsy for further workup.  Past Medical History:  Diagnosis Date  . Anemia   . Diabetes mellitus without complication ([place])   . GERD (gastroesophageal reflux disease)   . Hypertension     Past Surgical History:  Procedure Laterality Date  . APPENDECTOMY    . CAROTID ARTERY ANGIOPLASTY Right   . PERIPHERAL VASCULAR CATHETERIZATION Left 08/15/2016   Procedure: Lower Extremity Angiography;  Surgeon: Katha Cabal, MD;  Location: [facility];  Service: Cardiovascular;  Laterality: Left;  . PERIPHERAL VASCULAR CATHETERIZATION  08/15/2016   Procedure: Lower Extremity Intervention;  Surgeon: Katha Cabal, MD;  Location: [facility];  Service: Cardiovascular;;    Allergies: Patient has no known allergies.  Medications: Prior to Admission medications   Medication Sig Start Date End Date Taking? Authorizing Provider  albuterol (PROVENTIL HFA;VENTOLIN HFA) 108 (90 Base) MCG/ACT inhaler Inhale 2 puffs into the lungs every 6 (six) hours as needed for wheezing or shortness of breath. Patient not taking: Reported on 02/21/2017 01/02/17   Loletha Grayer, MD  aspirin EC 81 MG tablet Take 81 mg by mouth daily.    Historical Provider, MD  azithromycin (ZITHROMAX) 250 MG tablet One tab daily for three days Patient not taking: Reported on 02/21/2017 01/02/17   Loletha Grayer, MD  carvedilol (COREG) 6.25 MG tablet Take 6.25 mg by mouth 2 (two) times daily with a meal.    Historical Provider, MD  cefUROXime (CEFTIN) 500 MG tablet Take 1 tablet (500 mg total) by mouth 2 (two) times daily with a meal. Patient not taking: Reported on 02/21/2017 01/02/17   Loletha Grayer, MD  cilostazol (PLETAL) 100 MG tablet Take 100 mg by mouth 2 (two) times daily.    Historical Provider, MD  Cyanocobalamin (VITAMIN B 12) 250 MCG LOZG Take 500 mcg by mouth every morning.    Historical Provider, MD  ferrous sulfate 325 (65 FE) MG tablet Take 325 mg by mouth daily with breakfast.    Historical Provider, MD  finasteride (PROSCAR) 5 MG tablet Take 5 mg by mouth daily.    Historical Provider, MD  gabapentin (NEURONTIN) 300 MG capsule Take 300 mg by mouth 3 (three) times daily.    Historical Provider, MD  glimepiride (AMARYL) 2 MG tablet Take 2 mg by mouth daily with breakfast.    Historical Provider, MD  losartan-hydrochlorothiazide (HYZAAR) 100-25 MG tablet Take 100 tablets by mouth daily. 10/20/16   Historical Provider, MD  metFORMIN (GLUCOPHAGE-XR) 500 MG 24 hr tablet Take 500 mg by mouth 2 (two) times daily with a meal.    Historical Provider, MD  oseltamivir (TAMIFLU) 75 MG capsule Take 1 capsule (75 mg total) by mouth 2 (two) times daily. 01/02/17   Loletha Grayer, MD  pantoprazole (PROTONIX) 40 MG tablet Take 40 mg by mouth daily.    Historical Provider, MD  predniSONE (DELTASONE) 5 MG tablet 4 tabs po day1; 3 tabs po day2; 2 tabs po day3; 1 tab po day4 Patient not taking: Reported on 02/21/2017 01/02/17   Loletha Grayer, MD  sennosides-docusate sodium (SENOKOT-S) 8.6-50 MG tablet Take 1 tablet by mouth daily.    Historical Provider, MD  simvastatin (ZOCOR) 40 MG tablet  Take 40 mg by mouth daily.    Historical Provider, MD  tamsulosin (FLOMAX) 0.4 MG CAPS capsule Take 0.4 mg by mouth daily.    Historical Provider, MD     Family History  Problem Relation Age of Onset  . Cancer Sister     Social History   Social History  . Marital status: Widowed    Spouse name: N/A  . Number of children: N/A  . Years of education: N/A   Social History Main Topics  . Smoking status: Former Smoker    Packs/day: 0.50    Years: 30.00    Quit date: 02/22/2007  . Smokeless  tobacco: Never Used  . Alcohol use No  . Drug use: No  . Sexual activity: Not on file   Other Topics Concern  . Not on file   Social History Narrative  . No narrative on file    Review of Systems: A 12 point ROS discussed and pertinent positives are indicated in the HPI above.  All other systems are negative.  Review of Systems  Constitutional: Negative.   HENT: Negative.   Respiratory: Negative.   Cardiovascular: Negative.   Gastrointestinal: Negative.   Genitourinary: Negative.   Musculoskeletal: Negative.   Neurological: Negative.     Vital Signs: BP (!) 155/81   Pulse (!) 107   Temp 97.1 F (36.2 C)   Resp (!) 23   SpO2 97%   Physical Exam  Constitutional: He is oriented to person, place, and time. He appears well-developed and well-nourished. No distress.  HENT:  Head: Normocephalic and atraumatic.  Neck: Neck supple. No JVD present.  Cardiovascular: Normal rate, regular rhythm and normal heart sounds.  Exam reveals no friction rub.   No murmur heard. Pulmonary/Chest: Effort normal. No stridor. No respiratory distress. He has no wheezes. He has no rales.  Abdominal: Soft. Bowel sounds are normal. He exhibits no distension and no mass. There is no tenderness. There is no rebound and no guarding.  Musculoskeletal: He exhibits no edema.  Neurological: He is alert and oriented to person, place, and time.  Skin: He is not diaphoretic.  Vitals reviewed.   Mallampati Score:  MD Evaluation Airway: WNL Heart: WNL Abdomen: WNL Chest/ Lungs: WNL ASA  Classification: 2 Mallampati/Airway Score: One  Imaging: Dg Bone Survey Met  Result Date: 02/22/2017 CLINICAL DATA:  Anemia EXAM: METASTATIC BONE SURVEY COMPARISON:  None in PACs FINDINGS: There is hyperostosis frontalis interna. No lytic lesions are observed the calvarium. Pectoral girdle: No lytic or blastic lesions are observed. Spine: There is multilevel degenerative disc disease of the cervical spine. There is  mild degenerative disc change in the thoracic spine at multiple levels. There is moderate multilevel degenerative disc disease in the lumbar spine. Chest and visualized portions of the abdomen: No acute cardiopulmonary abnormality. No acute intra-abdominal abnormality. There is calcification in the wall of the thoracic and abdominal aorta. Pelvis and lower extremities: No lytic nor blastic lesions are observed. There is mild symmetric narrowing of the hip joint spaces. There is calcification in the common femoral and superficial femoral arteries. No lytic nor blastic bony lesions are observed. IMPRESSION: No lytic or blastic bony lesions are observed. There is hyperostosis frontalis interna. Thoracic and abdominal aortic atherosclerosis. Multilevel degenerative disc disease throughout the spine. Mild symmetric hip joint space narrowing bilaterally consistent with osteoarthritis. Electronically Signed   By: David  Martinique M.D.   On: 02/22/2017 08:03    Labs:  CBC:  Recent Labs  12/31/16  1501 01/01/17 0522 02/21/17 1507 03/14/17 0820  WBC 7.6 13.6* 6.8 8.5  HGB 8.2* 8.6* 8.2* 9.4*  HCT 25.2* 27.1* 26.0* 30.6*  PLT 250 306 357 408    COAGS:  Recent Labs  03/14/17 0820  INR 0.96  APTT 32    BMP:  Recent Labs  08/14/16 1235 12/31/16 1637 01/01/17 0522 02/21/17 1507  NA  --  128* 134* 134*  K  --  3.8 4.4 3.5  CL  --  93* 97* 97*  CO2  --  _0 GLUCOSE  --  282* 299* 93  BUN 12 29* 18 9  CALCIUM  --  8.3* 8.7* 8.8*  CREATININE 0.82 0.94 0.78 0.59*  GFRNONAA >60 >60 >60 >60  GFRAA >60 >60 >60 >60    LIVER FUNCTION TESTS:  Recent Labs  12/31/16 1637 02/21/17 1507  BILITOT 0.9 0.4  AST 46* 16  ALT 28 11*  ALKPHOS 49 70  PROT 6.7 6.9  ALBUMIN 2.9* 3.1*    Assessment and Plan:  For CT guided bone marrow biopsy today.  Risks and benefits discussed with the patient including, but not limited to bleeding, infection, damage to adjacent structures or low yield  requiring additional tests. All of the patient's questions were answered, patient is agreeable to proceed. Consent signed and in chart.  Thank you for this interesting consult.  I greatly enjoyed meeting Victor Dixon and look forward to participating in their care.  A copy of this report was sent to the requesting provider on this date.  Electronically SignedAletta Edouard T 03/14/2017, 10:26 AM   I spent a total of 30 Minutes  in face to face in clinical consultation, greater than 50% of which was counseling/coordinating care for bone marrow biopsy.

## 2017-03-14 NOTE — Discharge Instructions (Signed)
Needle Biopsy of the Bone °A bone biopsy is a procedure in which a small sample of bone is removed. The sample is taken with a needle. Then, the bone sample is looked at under a microscope to check for abnormalities. The sample is usually taken from a bone that is close to the skin. This procedure may be done to check for various problems with the bone. You may need this procedure if imaging tests or blood tests have indicated a possible problem. This procedure may be done to help determine if a bone tumor is cancerous (malignant). A bone biopsy can help to diagnose problems such as: °· Tumors of the bone (sarcomas) and bone marrow (multiple myeloma). °· Bone that forms abnormally (Paget disease). °· Noncancerous (benign) bone cysts. °· Bony growths. °· Infections in the bone. °Tell a health care provider about: °· Any allergies you have. °· All medicines you are taking, including vitamins, herbs, eye drops, creams, and over-the-counter medicines. °· Any problems you or family members have had with anesthetic medicines. °· Any blood disorders you have. °· Any surgeries you have had. °· Any medical conditions you have. °What are the risks? °Generally, this is a safe procedure. However, problems may occur, including: °· Excessive bleeding. °· Infection. °· Injury to surrounding tissue. °What happens before the procedure? °· Ask your health care provider about: °¨ Changing or stopping your regular medicines. This is especially important if you are taking diabetes medicines or blood thinners. °¨ Taking medicines such as aspirin and ibuprofen. These medicines can thin your blood. Do not take these medicines before your procedure if your health care provider instructs you not to. °· Follow instructions from your health care provider about eating or drinking restrictions. °· Plan to have someone take you home after the procedure. °· If you go home right after the procedure, plan to have someone with you for 24 hours. °What  happens during the procedure? °· An IV tube may be inserted into one of your veins. °· The injection site will be cleaned with a germ-killing solution (antiseptic). °· You will be given one or more of the following: °¨ A medicine to help you relax (sedative). °¨ A medicine to numb the area (local anesthetic). °· The sample of bone will be removed by putting a large needle through the skin and into the bone. °· The needle will be removed. °· A bandage (dressing) will be placed over the insertion site and taped in place. °The procedure may vary among health care providers and hospitals. °What happens after the procedure? °· Your blood pressure, heart rate, breathing rate, and blood oxygen level will be monitored often until the medicines you were given have worn off. °· Return to your normal activities as told by your health care provider. °This information is not intended to replace advice given to you by your health care provider. Make sure you discuss any questions you have with your health care provider. °Document Released: 09/07/2004 Document Revised: 04/06/2016 Document Reviewed: 12/07/2014 °Elsevier Interactive Patient Education © 2017 [employer] ° °

## 2017-03-14 NOTE — Procedures (Signed)
Interventional Radiology Procedure Note  Procedure: CT guided aspirate and core biopsy of right iliac bone Complications: None Recommendations: - Bedrest supine x 1 hrs - Follow biopsy results  Grover Woodfield T. Hartlyn Reigel, M.D Pager:  [phone]   

## 2017-03-23 ENCOUNTER — Encounter: Payer: Self-pay | Admitting: Hematology and Oncology

## 2017-03-26 LAB — CHROMOSOME ANALYSIS, BONE MARROW

## 2017-03-28 ENCOUNTER — Encounter: Payer: Self-pay | Admitting: Hematology and Oncology

## 2017-03-28 ENCOUNTER — Inpatient Hospital Stay: Payer: Medicare HMO | Attending: Hematology and Oncology | Admitting: Hematology and Oncology

## 2017-03-28 VITALS — BP 131/62 | HR 94 | Temp 98.1°F | Resp 18 | Wt 203.3 lb

## 2017-03-28 DIAGNOSIS — Z7982 Long term (current) use of aspirin: Secondary | ICD-10-CM | POA: Diagnosis not present

## 2017-03-28 DIAGNOSIS — Z7984 Long term (current) use of oral hypoglycemic drugs: Secondary | ICD-10-CM | POA: Diagnosis not present

## 2017-03-28 DIAGNOSIS — R634 Abnormal weight loss: Secondary | ICD-10-CM | POA: Diagnosis not present

## 2017-03-28 DIAGNOSIS — D472 Monoclonal gammopathy: Secondary | ICD-10-CM | POA: Diagnosis not present

## 2017-03-28 DIAGNOSIS — M1990 Unspecified osteoarthritis, unspecified site: Secondary | ICD-10-CM

## 2017-03-28 DIAGNOSIS — K219 Gastro-esophageal reflux disease without esophagitis: Secondary | ICD-10-CM | POA: Diagnosis not present

## 2017-03-28 DIAGNOSIS — Z87891 Personal history of nicotine dependence: Secondary | ICD-10-CM

## 2017-03-28 DIAGNOSIS — D649 Anemia, unspecified: Secondary | ICD-10-CM | POA: Insufficient documentation

## 2017-03-28 DIAGNOSIS — Z79899 Other long term (current) drug therapy: Secondary | ICD-10-CM | POA: Insufficient documentation

## 2017-03-28 DIAGNOSIS — E119 Type 2 diabetes mellitus without complications: Secondary | ICD-10-CM

## 2017-03-28 NOTE — Progress Notes (Signed)
[facility] day:  03/28/2017  Chief Complaint: Victor Dixon is a 76 y.o. male with anemia and an M-spike who is seen for review of interval bone marrow and discussion regarding direction of therapy.  HPI:  The patient was last seen in the medical oncology clinic on 02/28/2017.  A that time, decision was made to pursue bone marrow biopsy secondary to significant progressive anemia associated with a monoclonal protein.  Bone marrow aspirate and biopsy on 03/14/2017 revealed a slightly hypercellular marrow with trilineage hematopoiesis.  There was no significant dyspoiesis.  There was slight plasmacytosis (5%).  Plasma cells displayed staining for kappa and lambda light chians with slight kappa excess.  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  Cytogenetics are pending.  Symptomatically, he notes that his hands are strong, but sore.  He takes Tylenol before grabbing anything.  He denies any melena or hematochezia.  Regarding his diet, states he is "not been on vegetables". He eats baked chicken.   Past Medical History:  Diagnosis Date  . Anemia   . Diabetes mellitus without complication ([place])   . GERD (gastroesophageal reflux disease)   . Hypertension     Past Surgical History:  Procedure Laterality Date  . APPENDECTOMY    . CAROTID ARTERY ANGIOPLASTY Right   . PERIPHERAL VASCULAR CATHETERIZATION Left 08/15/2016   Procedure: Lower Extremity Angiography;  Surgeon: Katha Cabal, MD;  Location: [facility];  Service: Cardiovascular;  Laterality: Left;  . PERIPHERAL VASCULAR CATHETERIZATION  08/15/2016   Procedure: Lower Extremity Intervention;  Surgeon: Katha Cabal, MD;  Location: [place] [facility];  Service: Cardiovascular;;    Family History  Problem Relation Age of Onset  . Cancer Sister     Social History:  reports that he quit smoking about 10 years ago. He has a 15.00  pack-year smoking history. He has never used smokeless tobacco. He reports that he does not drink alcohol or use drugs.  He smoked a 1/2 pack a day since age 82.  He stopped smoking 10 years ago.  The patient's wife died in January 21, 2017.  The patient is alone today.  Allergies: No Known Allergies  Current Medications: Current Outpatient Prescriptions  Medication Sig Dispense Refill  . aspirin EC 81 MG tablet Take 81 mg by mouth daily.    . carvedilol (COREG) 6.25 MG tablet Take 6.25 mg by mouth 2 (two) times daily with a meal.    . cilostazol (PLETAL) 100 MG tablet Take 100 mg by mouth 2 (two) times daily.    . Cyanocobalamin (VITAMIN B 12) 250 MCG LOZG Take 500 mcg by mouth every morning.    . ferrous sulfate 325 (65 FE) MG tablet Take 325 mg by mouth daily with breakfast.    . finasteride (PROSCAR) 5 MG tablet Take 5 mg by mouth daily.    Marland Kitchen gabapentin (NEURONTIN) 300 MG capsule Take 300 mg by mouth 3 (three) times daily.    Marland Kitchen glimepiride (AMARYL) 2 MG tablet Take 2 mg by mouth daily with breakfast.    . losartan-hydrochlorothiazide (HYZAAR) 100-25 MG tablet Take 100 tablets by mouth daily.    . metFORMIN (GLUCOPHAGE-XR) 500 MG 24 hr tablet Take 500 mg by mouth 2 (two) times daily with a meal.    . oseltamivir (TAMIFLU) 75 MG capsule Take 1 capsule (75 mg total) by mouth 2 (two) times daily. 6 capsule 0  . pantoprazole (PROTONIX) 40 MG tablet  Take 40 mg by mouth daily.    . sennosides-docusate sodium (SENOKOT-S) 8.6-50 MG tablet Take 1 tablet by mouth daily.    . simvastatin (ZOCOR) 40 MG tablet Take 40 mg by mouth daily.    . tamsulosin (FLOMAX) 0.4 MG CAPS capsule Take 0.4 mg by mouth daily.    Marland Kitchen albuterol (PROVENTIL HFA;VENTOLIN HFA) 108 (90 Base) MCG/ACT inhaler Inhale 2 puffs into the lungs every 6 (six) hours as needed for wheezing or shortness of breath. (Patient not taking: Reported on 02/21/2017) 1 Inhaler 0  . azithromycin (ZITHROMAX) 250 MG tablet One tab daily for three days (Patient  not taking: Reported on 02/21/2017) 6 each 0  . cefUROXime (CEFTIN) 500 MG tablet Take 1 tablet (500 mg total) by mouth 2 (two) times daily with a meal. (Patient not taking: Reported on 02/21/2017) 7 tablet 0  . celecoxib (CELEBREX) 200 MG capsule Take 200 mg by mouth daily.    . predniSONE (DELTASONE) 5 MG tablet 4 tabs po day1; 3 tabs po day2; 2 tabs po day3; 1 tab po day4 (Patient not taking: Reported on 02/21/2017) 10 tablet 0   No current facility-administered medications for this visit.    Facility-Administered Medications Ordered in Other Visits  Medication Dose Route Frequency Provider Last Rate Last Dose  . heparin lock flush 100 unit/mL  500 Units Intravenous Once Monia Sabal, PA-C        Review of Systems:  GENERAL:  Feels "ok".  No fevers or sweats.  Weight loss of 40-45 pounds since 12/2016.  Weight loss of 2 pounds in past month. PERFORMANCE STATUS (ECOG):  1 HEENT:  No visual changes, sore throat, mouth sores or tenderness. Lungs: No shortness of breath or cough.  No hemoptysis. Cardiac:  No chest pain, palpitations, orthopnea, or PND. GI:  No nausea, vomiting, diarrhea, constipation, melena or hematochezia. GU:  No urgency, frequency, dysuria, or hematuria.  Nocturia (chronic). Musculoskeletal:  Sore hands and unable to make a fist (see HPI).  No back pain.  No muscle tenderness. Extremities:  Hand swelling.  No pain. Skin:  No rashes or skin changes. Neuro:  No headache, numbness or weakness, balance or coordination issues. Endocrine:  Diabetes.  No thyroid issues, hot flashes or night sweats. Psych:  No mood changes, depression or anxiety. Pain:  No focal pain. Review of systems:  All other systems reviewed and found to be negative.  Physical Exam: Blood pressure 131/62, pulse 94, temperature 98.1 F (36.7 C), temperature source Tympanic, resp. rate 18, weight 203 lb 4.2 oz (92.2 kg). GENERAL:  Well developed, well nourished, gentleman sitting hunched over in the  exam room in no acute distress. MENTAL STATUS:  Alert and oriented to person, place and time. HEAD:  Wearing a black golf cap. Short hair.  Normocephalic, atraumatic, face symmetric, no Cushingoid features. EYES:  Glasses.  Brown eyes.  Pupils equal round and reactive to light and accomodation.  No conjunctivitis or scleral icterus. ENT:  Oropharynx clear without lesion.  Tongue normal.  Dentures.  Mucous membranes moist.  RESPIRATORY:  Clear to auscultation without rales, wheezes or rhonchi. CARDIOVASCULAR:  Regular rate and rhythm without murmur, rub or gallop. ABDOMEN:  Soft, non-tender, with active bowel sounds, and no hepatosplenomegaly.  No masses. SKIN:  No rashes, ulcers or lesions. EXTREMITIES: Mild edema right hand.  Left wrist tender.  No skin discoloration or tenderness.  No palpable cords. LYMPH NODES: No palpable cervical, supraclavicular, axillary or inguinal adenopathy  NEUROLOGICAL: Unremarkable. PSYCH:  Appropriate.   No visits with results within 3 Day(s) from this visit.  Latest known visit with results is:  Hospital Outpatient Visit on 03/14/2017  Component Date Value Ref Range Status  . WBC 03/14/2017 8.5  3.8 - 10.6 K/uL Final  . RBC 03/14/2017 3.86* 4.40 - 5.90 MIL/uL Final  . Hemoglobin 03/14/2017 9.4* 13.0 - 18.0 g/dL Final  . HCT 03/14/2017 30.6* 40.0 - 52.0 % Final  . MCV 03/14/2017 79.3* 80.0 - 100.0 fL Final  . MCH 03/14/2017 24.3* 26.0 - 34.0 pg Final  . MCHC 03/14/2017 30.7* 32.0 - 36.0 g/dL Final  . RDW 03/14/2017 18.8* 11.5 - 14.5 % Final  . Platelets 03/14/2017 408  150 - 440 K/uL Final  . Neutrophils Relative % 03/14/2017 76  % Final  . Neutro Abs 03/14/2017 6.6* 1.4 - 6.5 K/uL Final  . Lymphocytes Relative 03/14/2017 13  % Final  . Lymphs Abs 03/14/2017 1.1  1.0 - 3.6 K/uL Final  . Monocytes Relative 03/14/2017 8  % Final  . Monocytes Absolute 03/14/2017 0.7  0.2 - 1.0 K/uL Final  . Eosinophils Relative 03/14/2017 2  % Final  . Eosinophils  Absolute 03/14/2017 0.1  0 - 0.7 K/uL Final  . Basophils Relative 03/14/2017 1  % Final  . Basophils Absolute 03/14/2017 0.0  0 - 0.1 K/uL Final  . aPTT 03/14/2017 32  24 - 36 seconds Final  . Prothrombin Time 03/14/2017 12.8  11.4 - 15.2 seconds Final  . INR 03/14/2017 0.96   Final  . Glucose-Capillary 03/14/2017 144* 65 - 99 mg/dL Final    Assessment:  Victor Dixon is a 76 y.o. male with anemia of chronic disease.  He has had a normocytic anemia since 10/19/2016.  Labs reveal a progressive normocytic anemia since 10/19/2016.  Hematocrit has decreased from 37.9 to 28.5 (hemoglobin 11.7 to 8.4).  He has been on oral iron and B12 x 1 year.  He has a monoclonal gammopathy of unknown significance (MGUS).  Diet is good.  His last colonoscopy was > 5 years ago.  He denies any history of gastric ulcer.  He denies any melena or hematochezia.  Labs on 01/26/2017 revealed a normal B12, RF.  Ferritin was elevated (acute phase reactant). TIBC was 244 (low) c/w anemia of chronic disease.  Reticulocyte count was 2.96%.  CRP was 63.6 (high).   Work-up on 02/21/2017 revealed a 0.3 gm/dL IgG monoclonal protein with kappa light chain specificity.  Free light chain ratio was 1.27 (normal).  24 hour UPEP was negative on 02/27/2017.  Hematocrit was 26.0, hemoglobin 8.2, MCV 81.2, platelets 357,000, WBC 6800 with an ANC of 4400.  Retic was 1.1% (inappropriately low).  Normal studies included: creatinine, calcium, Coombs, folate, ANA, and immunoglobulins.  Ferritin was 372.  Iron studies included a saturation of 7% and a TIBC of 217 (low).  Sed rate was 119.  Bone survey on 02/22/2017 revealed no lytic or blastic bony lesions. There was multilevel degenerative disc disease throughout the spine.  There was mild symmetric hip joint space narrowing bilaterally consistent with osteoarthritis.  Bone marrow aspirate and biopsy on 03/14/2017 revealed a slightly hypercellular marrow with trilineage hematopoiesis.  There  was no significant dyspoiesis.  There was slight plasmacytosis (5%).  Plasma cells displayed staining for kappa and lambda light chians with slight kappa excess.  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  Cytogenetics are pending.  SPEP has been followed: 0.6 gm/dL on 01/26/2017 and 0.3 gm/dL on  02/21/2017.  Symptomatically, he notes a weight loss of 40-45 pounds since 01-27-17 when his wife died.  He has significant pain in his hands due to arthritis.  Exam is stable.  Plan: 1.  Review bone marrow.  There was a slight plasmacytosis but no evidence of frank myeloma. Discuss a monoclonal gammopathy of unknown significance (MGUS). We discussed the risk of transformation to a lymphoproliferative disorder/multiple myeloma of 1% a year.  We discussed the plan for follow-up every 3-6 months.  2.  Consult rheumatology. 3.  RTC in 3 months for MD assessment and labs (CBC with diff, CMP, SPEP, ferritin, sed rate, iron studies)   Lequita Asal, MD  03/28/2017, 12:21 PM

## 2017-03-28 NOTE — Progress Notes (Signed)
Patient has recently been placed on Celebrex for arthritis in his hands.  Offers no complaints other than arthritis pain in his hands and shoulders.  Her for bone marrow results.

## 2017-04-03 ENCOUNTER — Encounter (HOSPITAL_COMMUNITY): Payer: Self-pay

## 2017-04-15 ENCOUNTER — Emergency Department
Admission: EM | Admit: 2017-04-15 | Discharge: 2017-04-16 | Disposition: A | Discharge status: Home / Self Care | Payer: Medicare HMO | Attending: Emergency Medicine | Admitting: Emergency Medicine

## 2017-04-15 DIAGNOSIS — Z87891 Personal history of nicotine dependence: Secondary | ICD-10-CM | POA: Diagnosis not present

## 2017-04-15 DIAGNOSIS — Z7982 Long term (current) use of aspirin: Secondary | ICD-10-CM | POA: Insufficient documentation

## 2017-04-15 DIAGNOSIS — Z7984 Long term (current) use of oral hypoglycemic drugs: Secondary | ICD-10-CM | POA: Insufficient documentation

## 2017-04-15 DIAGNOSIS — E1122 Type 2 diabetes mellitus with diabetic chronic kidney disease: Secondary | ICD-10-CM | POA: Insufficient documentation

## 2017-04-15 DIAGNOSIS — N189 Chronic kidney disease, unspecified: Secondary | ICD-10-CM | POA: Insufficient documentation

## 2017-04-15 DIAGNOSIS — L0231 Cutaneous abscess of buttock: Secondary | ICD-10-CM | POA: Diagnosis present

## 2017-04-15 DIAGNOSIS — I129 Hypertensive chronic kidney disease with stage 1 through stage 4 chronic kidney disease, or unspecified chronic kidney disease: Secondary | ICD-10-CM | POA: Insufficient documentation

## 2017-04-15 DIAGNOSIS — L0291 Cutaneous abscess, unspecified: Secondary | ICD-10-CM

## 2017-04-15 MED ORDER — SODIUM CHLORIDE 0.9 % IV BOLUS (SEPSIS)
500.0000 mL | Freq: Once | INTRAVENOUS | Status: AC
  Administered 2017-04-15: 500 mL via INTRAVENOUS

## 2017-04-15 MED ORDER — LIDOCAINE-PRILOCAINE 2.5-2.5 % EX CREA
TOPICAL_CREAM | Freq: Once | CUTANEOUS | Status: AC
  Administered 2017-04-15: via TOPICAL
  Filled 2017-04-15: qty 5

## 2017-04-15 MED ORDER — CLINDAMYCIN PHOSPHATE 600 MG/50ML IV SOLN
600.0000 mg | Freq: Once | INTRAVENOUS | Status: AC
  Administered 2017-04-15: 600 mg via INTRAVENOUS
  Filled 2017-04-15: qty 50

## 2017-04-15 NOTE — ED Triage Notes (Signed)
Pt ambulatory to triage with slow but steady gait. Pt reports about 3 days ago he noticed an abscess to the left side of his buttock. Reports some purulent drainage came out of it but none since and it is becoming more painful and larger.a

## 2017-04-16 LAB — BASIC METABOLIC PANEL
Anion gap: 9 (ref 5–15)
BUN: 15 mg/dL (ref 6–20)
CO2: 28 mmol/L (ref 22–32)
Calcium: 9.4 mg/dL (ref 8.9–10.3)
Chloride: 101 mmol/L (ref 101–111)
Creatinine, Ser: 0.67 mg/dL (ref 0.61–1.24)
Glucose, Bld: 118 mg/dL — ABNORMAL HIGH (ref 65–99)
POTASSIUM: 3.5 mmol/L (ref 3.5–5.1)
SODIUM: 138 mmol/L (ref 135–145)

## 2017-04-16 LAB — CBC
HEMATOCRIT: 29.3 % — AB (ref 40.0–52.0)
Hemoglobin: 9.1 g/dL — ABNORMAL LOW (ref 13.0–18.0)
MCH: 24.5 pg — AB (ref 26.0–34.0)
MCHC: 31.1 g/dL — ABNORMAL LOW (ref 32.0–36.0)
MCV: 78.7 fL — AB (ref 80.0–100.0)
Platelets: 139 10*3/uL — ABNORMAL LOW (ref 150–440)
RBC: 3.72 MIL/uL — AB (ref 4.40–5.90)
RDW: 18.9 % — AB (ref 11.5–14.5)
WBC: 8.2 10*3/uL (ref 3.8–10.6)

## 2017-04-16 MED ORDER — CLINDAMYCIN HCL 300 MG PO CAPS: 300.0000 mg | ORAL_CAPSULE | Freq: Three times a day (TID) | ORAL | 0 refills | Status: AC

## 2017-04-16 NOTE — ED Notes (Signed)
Reviewed d/c instructions, follow-up care, prescription, wound care with patient. Pt verbalized understanding

## 2017-04-16 NOTE — ED Notes (Signed)
Applied padded, sterile dressing per MD Dahlia Client order

## 2017-04-16 NOTE — Discharge Instructions (Signed)
Please follow up with your primary care physician to have your wound evaluated and your packing changed. Please return with any worsened condition.

## 2017-04-16 NOTE — ED Provider Notes (Signed)
[facility] Emergency Department Provider Note   ____________________________________________   First MD Initiated Contact with Patient 04/15/17 2323     (approximate)  I have reviewed the triage vital signs and the nursing notes.   HISTORY  Chief Complaint Abscess    HPI Victor Dixon is a 76 y.o. male who comes into the hospital today with a boil on his buttock. The patient reports that he's had it for about a week. The patient states that it busted and drained a little bit but it started back hurting again and swelling more. The patient reports that he had something similar a long time ago. The patient with some warm towels on it but it seemed to be getting worse. The patient rates his pain a 10 out of 10 in intensity. He has been taking Tylenol but it doesn't help. The patient denies any fevers. The patient is here today for evaluation of this pain.The patient has also had no nausea or vomiting.   Past Medical History:  Diagnosis Date  . Anemia   . Diabetes mellitus without complication ([place])   . GERD (gastroesophageal reflux disease)   . Hypertension     Patient Active Problem List   Diagnosis Date Noted  . Anemia 02/21/2017  . Monoclonal gammopathy 02/21/2017  . Pneumonia 12/31/2016  . Atherosclerosis of native arteries of extremity with intermittent claudication ([place]) 12/28/2016  . Essential hypertension 12/28/2016  . Hyperlipidemia 12/28/2016  . Type 2 diabetes mellitus ([place]) 12/28/2016  . GERD (gastroesophageal reflux disease) 12/28/2016  . Chronic kidney disease 10/17/2016    Past Surgical History:  Procedure Laterality Date  . APPENDECTOMY    . CAROTID ARTERY ANGIOPLASTY Right   . PERIPHERAL VASCULAR CATHETERIZATION Left 08/15/2016   Procedure: Lower Extremity Angiography;  Surgeon: Katha Cabal, MD;  Location: [place] [facility];  Service: Cardiovascular;  Laterality: Left;  . PERIPHERAL VASCULAR CATHETERIZATION   08/15/2016   Procedure: Lower Extremity Intervention;  Surgeon: Katha Cabal, MD;  Location: [place] [facility];  Service: Cardiovascular;;    Prior to Admission medications   Medication Sig Start Date End Date Taking? Authorizing Provider  albuterol (PROVENTIL HFA;VENTOLIN HFA) 108 (90 Base) MCG/ACT inhaler Inhale 2 puffs into the lungs every 6 (six) hours as needed for wheezing or shortness of breath. Patient not taking: Reported on 02/21/2017 01/02/17   Loletha Grayer, MD  aspirin EC 81 MG tablet Take 81 mg by mouth daily.    [provider]  azithromycin (ZITHROMAX) 250 MG tablet One tab daily for three days Patient not taking: Reported on 02/21/2017 01/02/17   Loletha Grayer, MD  carvedilol (COREG) 6.25 MG tablet Take 6.25 mg by mouth 2 (two) times daily with a meal.    [provider]  cefUROXime (CEFTIN) 500 MG tablet Take 1 tablet (500 mg total) by mouth 2 (two) times daily with a meal. Patient not taking: Reported on 02/21/2017 01/02/17   Loletha Grayer, MD  celecoxib (CELEBREX) 200 MG capsule Take 200 mg by mouth daily. 03/06/17   [provider]  cilostazol (PLETAL) 100 MG tablet Take 100 mg by mouth 2 (two) times daily.    [provider]  clindamycin (CLEOCIN) 300 MG capsule Take 1 capsule (300 mg total) by mouth 3 (three) times daily. 04/16/17 04/26/17  Loney Hering, MD  Cyanocobalamin (VITAMIN B 12) 250 MCG LOZG Take 500 mcg by mouth every morning.    [provider]  ferrous sulfate 325 (65 FE)  MG tablet Take 325 mg by mouth daily with breakfast.    [provider]  finasteride (PROSCAR) 5 MG tablet Take 5 mg by mouth daily.    [provider]  gabapentin (NEURONTIN) 300 MG capsule Take 300 mg by mouth 3 (three) times daily.    [provider]  glimepiride (AMARYL) 2 MG tablet Take 2 mg by mouth daily with breakfast.    [provider]  losartan-hydrochlorothiazide (HYZAAR) 100-25 MG tablet  Take 100 tablets by mouth daily. 10/20/16   [provider]  metFORMIN (GLUCOPHAGE-XR) 500 MG 24 hr tablet Take 500 mg by mouth 2 (two) times daily with a meal.    [provider]  oseltamivir (TAMIFLU) 75 MG capsule Take 1 capsule (75 mg total) by mouth 2 (two) times daily. 01/02/17   Loletha Grayer, MD  pantoprazole (PROTONIX) 40 MG tablet Take 40 mg by mouth daily.    [provider]  predniSONE (DELTASONE) 5 MG tablet 4 tabs po day1; 3 tabs po day2; 2 tabs po day3; 1 tab po day4 Patient not taking: Reported on 02/21/2017 01/02/17   Loletha Grayer, MD  sennosides-docusate sodium (SENOKOT-S) 8.6-50 MG tablet Take 1 tablet by mouth daily.    [provider]  simvastatin (ZOCOR) 40 MG tablet Take 40 mg by mouth daily.    [provider]  tamsulosin (FLOMAX) 0.4 MG CAPS capsule Take 0.4 mg by mouth daily.    [provider]    Allergies Patient has no known allergies.  Family History  Problem Relation Age of Onset  . Cancer Sister     Social History Social History  Substance Use Topics  . Smoking status: Former Smoker    Packs/day: 0.50    Years: 30.00    Quit date: 02/22/2007  . Smokeless tobacco: Never Used  . Alcohol use No    Review of Systems  Constitutional: No fever/chills Eyes: No visual changes. ENT: No sore throat. Cardiovascular: Denies chest pain. Respiratory: Denies shortness of breath. Gastrointestinal: No abdominal pain.  No nausea, no vomiting.  No diarrhea.  No constipation. Genitourinary: Negative for dysuria. Musculoskeletal: Negative for back pain. Skin: abscess Neurological: Negative for headaches, focal weakness or numbness.   ____________________________________________   PHYSICAL EXAM:  VITAL SIGNS: ED Triage Vitals  Enc Vitals Group     BP 04/15/17 2252 (!) 184/76     Pulse Rate 04/15/17 2252 (!) 108     Resp 04/15/17 2252 18     Temp 04/15/17 2252 97.7 F (36.5 C)     Temp Source  04/15/17 2252 Oral     SpO2 04/15/17 2252 97 %     Weight 04/15/17 2253 203 lb (92.1 kg)     Height 04/15/17 2253 6\' 1"  (1.854 m)     Head Circumference --      Peak Flow --      Pain Score 04/15/17 2252 10     Pain Loc --      Pain Edu? --      Excl. in [place]? --     Constitutional: Alert and oriented. Well appearing and in moderate distress. Eyes: Conjunctivae are normal. PERRL. EOMI. Head: Atraumatic. Nose: No congestion/rhinnorhea. Mouth/Throat: Mucous membranes are moist.  Oropharynx non-erythematous. Cardiovascular: Normal rate, regular rhythm. Grossly normal heart sounds.  Good peripheral circulation. Respiratory: Normal respiratory effort.  No retractions. Lungs CTAB. Gastrointestinal: Soft and nontender. No distention. Positive bowel sounds Rectal: Large abscess noted to left gluteus Maximus no extension into  the rectum or anus. Musculoskeletal: No lower extremity tenderness nor edema.   Neurologic:  Normal speech and language. No gross focal neurologic deficits are appreciated. No gait instability. Skin:  Skin is warm, dry and intact.  Psychiatric: Mood and affect are normal.   ____________________________________________   LABS (all labs ordered are listed, but only abnormal results are displayed)  Labs Reviewed  CBC - Abnormal; Notable for the following:       Result Value   RBC 3.72 (*)    Hemoglobin 9.1 (*)    HCT 29.3 (*)    MCV 78.7 (*)    MCH 24.5 (*)    MCHC 31.1 (*)    RDW 18.9 (*)    Platelets 139 (*)    All other components within normal limits  BASIC METABOLIC PANEL - Abnormal; Notable for the following:    Glucose, Bld 118 (*)    All other components within normal limits   ____________________________________________  EKG  none ____________________________________________  RADIOLOGY  No results found.  ____________________________________________   PROCEDURES  Procedure(s) performed: please, see procedure note(s).  Marland Kitchen.Incision and  Drainage Date/Time: 04/16/2017 12:45 AM Performed by: Loney Hering Authorized by: Loney Hering   Consent:    Consent obtained:  Verbal   Consent given by:  Patient Location:    Type:  Abscess   Size:  7cm   Location:  Anogenital   Anogenital location: gluteus maximus. Pre-procedure details:    Skin preparation:  Chloraprep Anesthesia (see MAR for exact dosages):    Anesthesia method:  Topical application   Topical anesthetic:  EMLA cream Procedure type:    Complexity:  Complex Procedure details:    Incision types:  Stab incision and single straight   Scalpel blade:  11   Wound management:  Probed and deloculated   Drainage:  Purulent and bloody   Drainage amount:  Copious   Packing materials:  1/4 in gauze Post-procedure details:    Patient tolerance of procedure:  Tolerated well, no immediate complications    Critical Care performed: No  ____________________________________________   INITIAL IMPRESSION / ASSESSMENT AND PLAN / ED COURSE  Pertinent labs & imaging results that were available during my care of the patient were reviewed by me and considered in my medical decision making (see chart for details).  This is a 76 year old male who comes into the hospital today with the gluteus abscess. I did check some blood work and the patient's glucose is unremarkable and his white blood cell count is 8.2. I was able to incise and drain the abscess with a large amount of purulent material removed. The patient reports that the area feels much better. I did pack the patient's wound did informed him he should follow-up with his primary care physician for removal and repacking if necessary. The patient will be given some antibiotics as well as there was some erythema and warmth to the area. He will be discharged home.      ____________________________________________   FINAL CLINICAL IMPRESSION(S) / ED DIAGNOSES  Final diagnoses:  Abscess      NEW MEDICATIONS  STARTED DURING THIS VISIT:  New Prescriptions   CLINDAMYCIN (CLEOCIN) 300 MG CAPSULE    Take 1 capsule (300 mg total) by mouth 3 (three) times daily.     Note:  This document was prepared using Dragon voice recognition software and may include unintentional dictation errors.    Loney Hering, MD 04/16/17 [phone]

## 2017-06-27 ENCOUNTER — Ambulatory Visit
Admission: RE | Admit: 2017-06-27 | Discharge: 2017-06-27 | Disposition: A | Discharge status: Home / Self Care | Payer: Medicare HMO | Source: Ambulatory Visit | Attending: Hematology and Oncology | Admitting: Hematology and Oncology

## 2017-06-27 ENCOUNTER — Telehealth: Payer: Self-pay | Admitting: *Deleted

## 2017-06-27 ENCOUNTER — Emergency Department
Admission: EM | Admit: 2017-06-27 | Discharge: 2017-06-27 | Disposition: A | Discharge status: Home / Self Care | Payer: Medicare HMO | Attending: Emergency Medicine | Admitting: Emergency Medicine

## 2017-06-27 ENCOUNTER — Inpatient Hospital Stay: Payer: Medicare HMO

## 2017-06-27 ENCOUNTER — Encounter: Payer: Self-pay | Admitting: Emergency Medicine

## 2017-06-27 ENCOUNTER — Inpatient Hospital Stay: Payer: Medicare HMO | Attending: Hematology and Oncology | Admitting: Hematology and Oncology

## 2017-06-27 ENCOUNTER — Encounter: Payer: Self-pay | Admitting: Hematology and Oncology

## 2017-06-27 VITALS — BP 127/61 | HR 63 | Temp 97.6°F | Resp 20 | Wt 214.5 lb

## 2017-06-27 DIAGNOSIS — E119 Type 2 diabetes mellitus without complications: Secondary | ICD-10-CM | POA: Diagnosis not present

## 2017-06-27 DIAGNOSIS — I70213 Atherosclerosis of native arteries of extremities with intermittent claudication, bilateral legs: Secondary | ICD-10-CM | POA: Insufficient documentation

## 2017-06-27 DIAGNOSIS — Z79899 Other long term (current) drug therapy: Secondary | ICD-10-CM

## 2017-06-27 DIAGNOSIS — Z87891 Personal history of nicotine dependence: Secondary | ICD-10-CM | POA: Insufficient documentation

## 2017-06-27 DIAGNOSIS — K219 Gastro-esophageal reflux disease without esophagitis: Secondary | ICD-10-CM | POA: Insufficient documentation

## 2017-06-27 DIAGNOSIS — Z7984 Long term (current) use of oral hypoglycemic drugs: Secondary | ICD-10-CM | POA: Insufficient documentation

## 2017-06-27 DIAGNOSIS — I824Z3 Acute embolism and thrombosis of unspecified deep veins of distal lower extremity, bilateral: Secondary | ICD-10-CM | POA: Diagnosis not present

## 2017-06-27 DIAGNOSIS — I82432 Acute embolism and thrombosis of left popliteal vein: Secondary | ICD-10-CM | POA: Insufficient documentation

## 2017-06-27 DIAGNOSIS — E1122 Type 2 diabetes mellitus with diabetic chronic kidney disease: Secondary | ICD-10-CM | POA: Insufficient documentation

## 2017-06-27 DIAGNOSIS — D472 Monoclonal gammopathy: Secondary | ICD-10-CM | POA: Diagnosis not present

## 2017-06-27 DIAGNOSIS — I10 Essential (primary) hypertension: Secondary | ICD-10-CM

## 2017-06-27 DIAGNOSIS — I129 Hypertensive chronic kidney disease with stage 1 through stage 4 chronic kidney disease, or unspecified chronic kidney disease: Secondary | ICD-10-CM | POA: Insufficient documentation

## 2017-06-27 DIAGNOSIS — Z7902 Long term (current) use of antithrombotics/antiplatelets: Secondary | ICD-10-CM | POA: Diagnosis not present

## 2017-06-27 DIAGNOSIS — N189 Chronic kidney disease, unspecified: Secondary | ICD-10-CM | POA: Diagnosis not present

## 2017-06-27 DIAGNOSIS — I82413 Acute embolism and thrombosis of femoral vein, bilateral: Secondary | ICD-10-CM | POA: Insufficient documentation

## 2017-06-27 DIAGNOSIS — R600 Localized edema: Secondary | ICD-10-CM | POA: Insufficient documentation

## 2017-06-27 DIAGNOSIS — R609 Edema, unspecified: Secondary | ICD-10-CM

## 2017-06-27 DIAGNOSIS — Z7982 Long term (current) use of aspirin: Secondary | ICD-10-CM | POA: Insufficient documentation

## 2017-06-27 DIAGNOSIS — M1990 Unspecified osteoarthritis, unspecified site: Secondary | ICD-10-CM

## 2017-06-27 DIAGNOSIS — I739 Peripheral vascular disease, unspecified: Secondary | ICD-10-CM | POA: Diagnosis not present

## 2017-06-27 DIAGNOSIS — D6489 Other specified anemias: Secondary | ICD-10-CM

## 2017-06-27 DIAGNOSIS — M353 Polymyalgia rheumatica: Secondary | ICD-10-CM | POA: Insufficient documentation

## 2017-06-27 DIAGNOSIS — R2243 Localized swelling, mass and lump, lower limb, bilateral: Secondary | ICD-10-CM | POA: Diagnosis present

## 2017-06-27 LAB — CBC WITH DIFFERENTIAL/PLATELET
Basophils Absolute: 0.1 10*3/uL (ref 0–0.1)
Basophils Relative: 1 %
Eosinophils Absolute: 0 10*3/uL (ref 0–0.7)
Eosinophils Relative: 0 %
HCT: 33.9 % — ABNORMAL LOW (ref 40.0–52.0)
Hemoglobin: 10.9 g/dL — ABNORMAL LOW (ref 13.0–18.0)
Lymphocytes Relative: 17 %
Lymphs Abs: 1.5 10*3/uL (ref 1.0–3.6)
MCH: 26.6 pg (ref 26.0–34.0)
MCHC: 32 g/dL (ref 32.0–36.0)
MCV: 83 fL (ref 80.0–100.0)
Monocytes Absolute: 0.4 10*3/uL (ref 0.2–1.0)
Monocytes Relative: 5 %
Neutro Abs: 6.7 10*3/uL — ABNORMAL HIGH (ref 1.4–6.5)
Neutrophils Relative %: 77 %
Platelets: 140 10*3/uL — ABNORMAL LOW (ref 150–440)
RBC: 4.09 MIL/uL — ABNORMAL LOW (ref 4.40–5.90)
RDW: 16.8 % — ABNORMAL HIGH (ref 11.5–14.5)
WBC: 8.8 10*3/uL (ref 3.8–10.6)

## 2017-06-27 LAB — COMPREHENSIVE METABOLIC PANEL
ALT: 12 U/L — ABNORMAL LOW (ref 17–63)
AST: 19 U/L (ref 15–41)
Albumin: 4.1 g/dL (ref 3.5–5.0)
Alkaline Phosphatase: 55 U/L (ref 38–126)
Anion gap: 8 (ref 5–15)
BUN: 11 mg/dL (ref 6–20)
CO2: 30 mmol/L (ref 22–32)
Calcium: 9.4 mg/dL (ref 8.9–10.3)
Chloride: 100 mmol/L — ABNORMAL LOW (ref 101–111)
Creatinine, Ser: 0.62 mg/dL (ref 0.61–1.24)
GFR calc Af Amer: >60 mL/min (ref >60)
GFR calc non Af Amer: >60 mL/min (ref >60)
Glucose, Bld: 102 mg/dL — ABNORMAL HIGH (ref 65–99)
Potassium: 3.7 mmol/L (ref 3.5–5.1)
Sodium: 138 mmol/L (ref 135–145)
Total Bilirubin: 0.7 mg/dL (ref 0.3–1.2)
Total Protein: 7.1 g/dL (ref 6.5–8.1)

## 2017-06-27 LAB — FERRITIN: Ferritin: 114 ng/mL (ref 24–336)

## 2017-06-27 LAB — IRON AND TIBC
Iron: 31 ug/dL — ABNORMAL LOW (ref 45–182)
Saturation Ratios: 11 % — ABNORMAL LOW (ref 17.9–39.5)
TIBC: 286 ug/dL (ref 250–450)
UIBC: 255 ug/dL

## 2017-06-27 LAB — SEDIMENTATION RATE: Sed Rate: 20 mm/hr (ref 0–20)

## 2017-06-27 MED ORDER — APIXABAN 5 MG PO TABS: 10.0000 mg | ORAL_TABLET | Freq: Two times a day (BID) | ORAL | 0 refills | Status: DC

## 2017-06-27 MED ORDER — APIXABAN 5 MG PO TABS: 5.0000 mg | ORAL_TABLET | Freq: Two times a day (BID) | ORAL | 0 refills | Status: DC

## 2017-06-27 NOTE — Discharge Instructions (Signed)
Please seek medical attention for any high fevers, chest pain, shortness of breath, change in behavior, persistent vomiting, bloody stool or any other new or concerning symptoms.  

## 2017-06-27 NOTE — ED Provider Notes (Signed)
[facility] Emergency Department Provider Note  ____________________________________________   I have reviewed the triage vital signs and the nursing notes.   HISTORY  Chief Complaint Leg Swelling   History limited by: Not Limited   HPI Victor Dixon is a 76 y.o. male who presents to the emergency department today because of DVTs seen on ultrasound. Ultrasound was ordered by oncologist due to bilateral leg swelling. Patient states this one is been around for roughly 1 month. He denies any pain. He does state that he is up on his feet fairly frequently. He says he does have a history of blood clots in the past and at one time was on a blood thinning medication however has not been on it for a long time. The patient denies any chest pain or shortness breath. Denies any fevers.   Past Medical History:  Diagnosis Date  . Anemia   . Diabetes mellitus without complication ([place])   . GERD (gastroesophageal reflux disease)   . Hypertension     Patient Active Problem List   Diagnosis Date Noted  . Bilateral lower extremity edema 06/27/2017  . Anemia 02/21/2017  . Monoclonal gammopathy of unknown significance (MGUS) 02/21/2017  . Pneumonia 12/31/2016  . Atherosclerosis of native arteries of extremity with intermittent claudication ([place]) 12/28/2016  . Essential hypertension 12/28/2016  . Hyperlipidemia 12/28/2016  . Type 2 diabetes mellitus ([place]) 12/28/2016  . GERD (gastroesophageal reflux disease) 12/28/2016  . Chronic kidney disease 10/17/2016    Past Surgical History:  Procedure Laterality Date  . APPENDECTOMY    . CAROTID ARTERY ANGIOPLASTY Right   . PERIPHERAL VASCULAR CATHETERIZATION Left 08/15/2016   Procedure: Lower Extremity Angiography;  Surgeon: Katha Cabal, MD;  Location: [place] [facility];  Service: Cardiovascular;  Laterality: Left;  . PERIPHERAL VASCULAR CATHETERIZATION  08/15/2016   Procedure: Lower Extremity Intervention;   Surgeon: Katha Cabal, MD;  Location: [place] [facility];  Service: Cardiovascular;;    Prior to Admission medications   Medication Sig Start Date End Date Taking? Authorizing Provider  aspirin EC 81 MG tablet Take 81 mg by mouth daily.    [provider]  CALCIUM-VITAMIN D PO Take 1 tablet by mouth 2 (two) times daily. 05/31/17 05/31/18  [provider]  carvedilol (COREG) 6.25 MG tablet Take 6.25 mg by mouth 2 (two) times daily with a meal.    [provider]  cilostazol (PLETAL) 100 MG tablet Take 100 mg by mouth 2 (two) times daily.    [provider]  Cyanocobalamin (VITAMIN B 12) 250 MCG LOZG Take 500 mcg by mouth every morning.    [provider]  ferrous sulfate 325 (65 FE) MG tablet Take 325 mg by mouth daily with breakfast.    [provider]  finasteride (PROSCAR) 5 MG tablet Take 5 mg by mouth daily.    [provider]  gabapentin (NEURONTIN) 300 MG capsule Take 300 mg by mouth 3 (three) times daily.    [provider]  glimepiride (AMARYL) 2 MG tablet Take 2 mg by mouth daily with breakfast.    [provider]  metFORMIN (GLUCOPHAGE-XR) 500 MG 24 hr tablet Take 500 mg by mouth 2 (two) times daily with a meal.    [provider]  pantoprazole (PROTONIX) 40 MG tablet Take 40 mg by mouth daily.    [provider]  predniSONE (DELTASONE) 5 MG tablet 4 tabs po day1; 3 tabs po day2; 2 tabs po day3; 1  tab po day4 01/02/17   Loletha Grayer, MD  senna (SENOKOT) 8.6 MG tablet Take 1 tablet by mouth daily.    [provider]  simvastatin (ZOCOR) 40 MG tablet Take 40 mg by mouth daily.    [provider]    Allergies Patient has no known allergies.  Family History  Problem Relation Age of Onset  . Cancer Sister     Social History Social History  Substance Use Topics  . Smoking status: Former Smoker    Packs/day: 0.50    Years: 30.00    Quit date: 02/22/2007  .  Smokeless tobacco: Never Used  . Alcohol use No    Review of Systems Constitutional: No fever/chills Eyes: No visual changes. ENT: No sore throat. Cardiovascular: Denies chest pain. Respiratory: Denies shortness of breath. Gastrointestinal: No abdominal pain.  No nausea, no vomiting.  No diarrhea.   Genitourinary: Negative for dysuria. Musculoskeletal: Positive for bilateral leg swelling.  Skin: Negative for rash. Neurological: Negative for headaches, focal weakness or numbness.  ____________________________________________   PHYSICAL EXAM:  VITAL SIGNS: ED Triage Vitals  Enc Vitals Group     BP 06/27/17 1718 (!) 179/86     Pulse Rate 06/27/17 1718 85     Resp 06/27/17 1718 18     Temp 06/27/17 1718 98 F (36.7 C)     Temp Source 06/27/17 1718 Oral     SpO2 06/27/17 1718 97 %     Weight 06/27/17 1719 213 lb (96.6 kg)     Height 06/27/17 1719 6\' 1"  (1.854 m)   Constitutional: Alert and oriented. Well appearing and in no distress. Eyes: Conjunctivae are normal.  ENT   Head: Normocephalic and atraumatic.   Nose: No congestion/rhinnorhea.   Mouth/Throat: Mucous membranes are moist.   Neck: No stridor. Hematological/Lymphatic/Immunilogical: No cervical lymphadenopathy. Cardiovascular: Normal rate, regular rhythm.  No murmurs, rubs, or gallops.  Respiratory: Normal respiratory effort without tachypnea nor retractions. Breath sounds are clear and equal bilaterally. No wheezes/rales/rhonchi. Gastrointestinal: Soft and non tender. No rebound. No guarding.  Genitourinary: Deferred Musculoskeletal: Normal range of motion in all extremities. Positive bilateral lower extremity edema. No warmth. No redness. No tenderness.  Neurologic:  Normal speech and language. No gross focal neurologic deficits are appreciated.  Skin:  Skin is warm, dry and intact. No rash noted. Psychiatric: Mood and affect are normal. Speech and behavior are normal. Patient exhibits appropriate  insight and judgment.  ____________________________________________    LABS (pertinent positives/negatives)  None  ____________________________________________   EKG  None  ____________________________________________    RADIOLOGY  None  ____________________________________________   PROCEDURES  Procedures  ____________________________________________   INITIAL IMPRESSION / ASSESSMENT AND PLAN / ED COURSE  Pertinent labs & imaging results that were available during my care of the patient were reviewed by me and considered in my medical decision making (see chart for details).  Patient presents to the emergency department today because of concerns for bilateral DVT seen on outpatient ultrasound. Discussed with Dr. dew with vascular surgery. Will plan on starting patient on a liquids. While patient follow-up with Dr. Delana Meyer.   ____________________________________________   FINAL CLINICAL IMPRESSION(S) / ED DIAGNOSES  Final diagnoses:  Peripheral edema  Deep vein thrombosis (DVT) of distal vein of both lower extremities, unspecified chronicity ([place])     Note: This dictation was prepared with Dragon dictation. Any transcriptional errors that result from this process are unintentional     Nance Pear, MD 06/27/17 1754

## 2017-06-27 NOTE — Progress Notes (Signed)
. [facility] day:  06/27/2017  Chief Complaint: Rui Wordell is a 76 y.o. male with anemia of chronic disease and a monoclonal gammopathy of unknown significance who is seen for 3 month assessment.  HPI:  The patient was last seen in the medical oncology clinic on 03/28/2017.  A that time, bone marrow was reviewed.  There was no evidence of myeloma.  He was felt to have a monoclonal gammopathy of unknown significance.  Anemia was felt likely due to his severe arthritis.  He was referred to rheumatology.  He was seen by Christele Behalal-Bock on 04/26/2017. He noted severe stiffness in both shoulders in the morning possibly representing polymyalgia rheumatica.  A prednisone pulse (15 mg/day) was initiated on 05/10/2017.  Follow-up on 05/31/2017 revealed tremendous improvement in his hands and shoulders.  A prednisone taper was initiated. CRP had decreased from 63.6 on 01/26/2017 to 0.5 on 05/31/2017.  ESR had decreased from 119 to 12.  He was scheduled for follow-up in 2 months.  Symptomatically, he "feels real good".  He is mowing the lawn.  He can reach up into the cabinet.  He is taking prednisone 12.5 mg/day.    He commens that he was "diagnosed with a blood clot" by [facility] in [place].  Review of records from Dr. Delana Meyer reveals peripheral vascular disease and symptoms of claudication.  He has a follow-up with Dr. Delana Meyer on 07/12/2017.   Past Medical History:  Diagnosis Date  . Anemia   . Diabetes mellitus without complication ([place])   . GERD (gastroesophageal reflux disease)   . Hypertension     Past Surgical History:  Procedure Laterality Date  . APPENDECTOMY    . CAROTID ARTERY ANGIOPLASTY Right   . PERIPHERAL VASCULAR CATHETERIZATION Left 08/15/2016   Procedure: Lower Extremity Angiography;  Surgeon: Katha Cabal, MD;  Location: [place] [facility];  Service: Cardiovascular;  Laterality: Left;  . PERIPHERAL  VASCULAR CATHETERIZATION  08/15/2016   Procedure: Lower Extremity Intervention;  Surgeon: Katha Cabal, MD;  Location: [place] [facility];  Service: Cardiovascular;;    Family History  Problem Relation Age of Onset  . Cancer Sister     Social History:  reports that he quit smoking about 10 years ago. He has a 15.00 pack-year smoking history. He has never used smokeless tobacco. He reports that he does not drink alcohol or use drugs.  He smoked a 1/2 pack a day since age 41.  He stopped smoking 10 years ago.  The patient's wife died in 12-26-16.  The patient is alone today.  Allergies: No Known Allergies  Current Medications: Current Outpatient Prescriptions  Medication Sig Dispense Refill  . aspirin EC 81 MG tablet Take 81 mg by mouth daily.    Marland Kitchen CALCIUM-VITAMIN D PO Take 1 tablet by mouth 2 (two) times daily.    . carvedilol (COREG) 6.25 MG tablet Take 6.25 mg by mouth 2 (two) times daily with a meal.    . cilostazol (PLETAL) 100 MG tablet Take 100 mg by mouth 2 (two) times daily.    . Cyanocobalamin (VITAMIN B 12) 250 MCG LOZG Take 500 mcg by mouth every morning.    . ferrous sulfate 325 (65 FE) MG tablet Take 325 mg by mouth daily with breakfast.    . finasteride (PROSCAR) 5 MG tablet Take 5 mg by mouth daily.    Marland Kitchen gabapentin (NEURONTIN) 300 MG capsule Take 300 mg by mouth 3 (  three) times daily.    Marland Kitchen glimepiride (AMARYL) 2 MG tablet Take 2 mg by mouth daily with breakfast.    . metFORMIN (GLUCOPHAGE-XR) 500 MG 24 hr tablet Take 500 mg by mouth 2 (two) times daily with a meal.    . pantoprazole (PROTONIX) 40 MG tablet Take 40 mg by mouth daily.    . predniSONE (DELTASONE) 5 MG tablet 4 tabs po day1; 3 tabs po day2; 2 tabs po day3; 1 tab po day4 10 tablet 0  . senna (SENOKOT) 8.6 MG tablet Take 1 tablet by mouth daily.    . simvastatin (ZOCOR) 40 MG tablet Take 40 mg by mouth daily.     No current facility-administered medications for this visit.    Facility-Administered  Medications Ordered in Other Visits  Medication Dose Route Frequency Provider Last Rate Last Dose  . heparin lock flush 100 unit/mL  500 Units Intravenous Once Monia Sabal, PA-C        Review of Systems:  GENERAL:  Feels "real good".  No fevers or sweats.  Weight loss of 40-45 pounds since 12/2016.  Weight gain of 11 pounds in 2 months. PERFORMANCE STATUS (ECOG):  1 HEENT:  No visual changes, sore throat, mouth sores or tenderness. Lungs: No shortness of breath or cough.  No hemoptysis. Cardiac:  No chest pain, palpitations, orthopnea, or PND. GI:  No nausea, vomiting, diarrhea, constipation, melena or hematochezia. GU:  No urgency, frequency, dysuria, or hematuria.  Nocturia (chronic). Musculoskeletal:  Hands and shoulders feel much better (see HPI).  No back pain.  No muscle tenderness. Extremities:  No pain.  Lower extremity swelling. Skin:  No rashes or skin changes. Neuro:  No headache, numbness or weakness, balance or coordination issues. Endocrine:  Diabetes.  No thyroid issues, hot flashes or night sweats. Psych:  No mood changes, depression or anxiety. Pain:  No focal pain. Review of systems:  All other systems reviewed and found to be negative.  Physical Exam: Blood pressure 127/61, pulse 63, temperature 97.6 F (36.4 C), temperature source Tympanic, resp. rate 20, weight 214 lb 8.1 oz (97.3 kg). GENERAL:  Well developed, well nourished, gentleman sitting hunched over in the exam room in no acute distress. MENTAL STATUS:  Alert and oriented to person, place and time. HEAD:  Wearing a golf cap. Short hair.  Normocephalic, atraumatic, face symmetric, no Cushingoid features. EYES:  Glasses.  Brown eyes.  Pupils equal round and reactive to light and accomodation.  No conjunctivitis or scleral icterus. ENT:  Oropharynx clear without lesion.  Tongue normal.  Dentures.  Mucous membranes moist.  RESPIRATORY:  Clear to auscultation without rales, wheezes or rhonchi. CARDIOVASCULAR:   Regular rate and rhythm without murmur, rub or gallop. ABDOMEN:  Soft, non-tender, with active bowel sounds, and no hepatosplenomegaly.  No masses. SKIN:  No rashes, ulcers or lesions. EXTREMITIES: Bilateral 2+ lower extremity edema .  No skin discoloration or tenderness.  No palpable cords. LYMPH NODES: No palpable cervical, supraclavicular, axillary or inguinal adenopathy  NEUROLOGICAL: Unremarkable. PSYCH:  Appropriate.   Appointment on 06/27/2017  Component Date Value Ref Range Status  . WBC 06/27/2017 8.8  3.8 - 10.6 K/uL Final  . RBC 06/27/2017 4.09* 4.40 - 5.90 MIL/uL Final  . Hemoglobin 06/27/2017 10.9* 13.0 - 18.0 g/dL Final  . HCT 06/27/2017 33.9* 40.0 - 52.0 % Final  . MCV 06/27/2017 83.0  80.0 - 100.0 fL Final  . MCH 06/27/2017 26.6  26.0 - 34.0 pg Final  . MCHC 06/27/2017  32.0  32.0 - 36.0 g/dL Final  . RDW 06/27/2017 16.8* 11.5 - 14.5 % Final  . Platelets 06/27/2017 140* 150 - 440 K/uL Final  . Neutrophils Relative % 06/27/2017 77  % Final  . Neutro Abs 06/27/2017 6.7* 1.4 - 6.5 K/uL Final  . Lymphocytes Relative 06/27/2017 17  % Final  . Lymphs Abs 06/27/2017 1.5  1.0 - 3.6 K/uL Final  . Monocytes Relative 06/27/2017 5  % Final  . Monocytes Absolute 06/27/2017 0.4  0.2 - 1.0 K/uL Final  . Eosinophils Relative 06/27/2017 0  % Final  . Eosinophils Absolute 06/27/2017 0.0  0 - 0.7 K/uL Final  . Basophils Relative 06/27/2017 1  % Final  . Basophils Absolute 06/27/2017 0.1  0 - 0.1 K/uL Final  . Sodium 06/27/2017 138  135 - 145 mmol/L Final  . Potassium 06/27/2017 3.7  3.5 - 5.1 mmol/L Final  . Chloride 06/27/2017 100* 101 - 111 mmol/L Final  . CO2 06/27/2017 30  22 - 32 mmol/L Final  . Glucose, Bld 06/27/2017 102* 65 - 99 mg/dL Final  . BUN 06/27/2017 11  6 - 20 mg/dL Final  . Creatinine, Ser 06/27/2017 0.62  0.61 - 1.24 mg/dL Final  . Calcium 06/27/2017 9.4  8.9 - 10.3 mg/dL Final  . Total Protein 06/27/2017 7.1  6.5 - 8.1 g/dL Final  . Albumin 06/27/2017 4.1  3.5  - 5.0 g/dL Final  . AST 06/27/2017 19  15 - 41 U/L Final  . ALT 06/27/2017 12* 17 - 63 U/L Final  . Alkaline Phosphatase 06/27/2017 55  38 - 126 U/L Final  . Total Bilirubin 06/27/2017 0.7  0.3 - 1.2 mg/dL Final  . GFR calc non Af Amer 06/27/2017 >60  >60 mL/min Final  . GFR calc Af Amer 06/27/2017 >60  >60 mL/min Final   Comment: (NOTE) The eGFR has been calculated using the CKD EPI equation. This calculation has not been validated in all clinical situations. eGFR's persistently <60 mL/min signify possible Chronic Kidney Disease.   . Anion gap 06/27/2017 8  5 - 15 Final  . Sed Rate 06/27/2017 20  0 - 20 mm/hr Final    Assessment:  Agron Macho is a 76 y.o. male with anemia of chronic disease.  He has had a normocytic anemia since 10/19/2016.  Labs reveal a progressive normocytic anemia since 10/19/2016.  Hematocrit has decreased from 37.9 to 28.5 (hemoglobin 11.7 to 8.4).  He has been on oral iron and B12 x 1 year.  He has a monoclonal gammopathy of unknown significance (MGUS).  Diet is good.  His last colonoscopy was > 5 years ago.  He denies any history of gastric ulcer.  He denies any melena or hematochezia.  Labs on 01/26/2017 revealed a normal B12, RF.  Ferritin was elevated (acute phase reactant). TIBC was 244 (low) c/w anemia of chronic disease.  Reticulocyte count was 2.96%.  CRP was 63.6 (high).   Work-up on 02/21/2017 revealed a 0.3 gm/dL IgG monoclonal protein with kappa light chain specificity.  Free light chain ratio was 1.27 (normal).  24 hour UPEP was negative on 02/27/2017.  Hematocrit was 26.0, hemoglobin 8.2, MCV 81.2, platelets 357,000, WBC 6800 with an ANC of 4400.  Retic was 1.1% (inappropriately low).  Normal studies included: creatinine, calcium, Coom[place], folate, ANA, and immunoglobulins.  Ferritin was 372.  Iron studies included a saturation of 7% and a TIBC of 217 (low).  Sed rate was 119.  Bone survey on 02/22/2017 revealed no  lytic or blastic bony lesions.  There was multilevel degenerative disc disease throughout the spine.  There was mild symmetric hip joint space narrowing bilaterally consistent with osteoarthritis.  Bone marrow aspirate and biopsy on 03/14/2017 revealed a slightly hypercellular marrow with trilineage hematopoiesis.  There was no significant dyspoiesis.  There was slight plasmacytosis (5%).  Plasma cells displayed staining for kappa and lambda light chians with slight kappa excess.  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  Cytogenetics were normal (46, XY).  SPEP has been followed: 0.6 gm/dL on 01/26/2017 and 0.3 gm/dL on 02/21/2017.  He has polymyalgia rheumatica.  With a prednisone pulse, joint pain dramatically improved.  ESR and CRP normalized after initiation of prednisone.  Anemia has improved.  He has peripheral vascular disease and is followed by vascular surgery.  Symptomatically, he feels good.  He has gained weight.  Exam reveals bilateral lower extremity edema..  Plan: 1.  Labs today:  CBC with diff, CMP, SPEP, ferritin, sed rate, iron studies. 2.  Discuss rheumatology consultation and diagnosis of polymyalgia rheumatica.  Discuss improvement in counts since initiation of steroids. 3.  Discuss monoclonal gammopathy of unknown significance.  Discuss risk of transformation to myeloma or a lymphoproliferative disorder 1%/year.  Discss the plan for follow-up every 6 months. 4.  Discuss new lower extremity edema and need for bilateral lower extremity ultrasound to r/o DVT. 5.  RTC in 6 months for MD assessment and labs (CBC with diff, CMP, SPEP, free light chains, ferritin, sed rate).   Lequita Asal, MD  06/27/2017, 12:04 PM

## 2017-06-27 NOTE — Telephone Encounter (Addendum)
Patient scheduled for bilateral venous doppler - Kim in [place] called to report patient is positive for DVT in femoral veins (duplicated - 4 veins occluded in left popliteal. Also occlusions in right femoral veins.   MD notified.  Dr. Mike Gip notifying vein and vascular.  Called back to [place] and informed Maudie Mercury that per Dr. Lucky Cowboy - patient should go to ED.  Dr. Delana Meyer is currently in surgery and will be in touch with patient when he is finished per Dr. Mike Gip.

## 2017-06-27 NOTE — ED Notes (Signed)

## 2017-06-27 NOTE — Progress Notes (Signed)
Patient offers no complaints today.  States his arthritis pain is much better.

## 2017-06-27 NOTE — ED Triage Notes (Signed)
Patient was sent by MD for ultrasound of legs due to swelling. Patient denies pain. States they have been swollen for about one month. States he has had history of DVTs. States has taken blood thinners in the past but is not on any now.

## 2017-06-28 LAB — PROTEIN ELECTROPHORESIS, SERUM
A/G Ratio: 1.3 (ref 0.7–1.7)
Albumin ELP: 3.5 g/dL (ref 2.9–4.4)
Alpha-1-Globulin: 0.2 g/dL (ref 0.0–0.4)
Alpha-2-Globulin: 0.7 g/dL (ref 0.4–1.0)
Beta Globulin: 0.9 g/dL (ref 0.7–1.3)
Gamma Globulin: 0.8 g/dL (ref 0.4–1.8)
Globulin, Total: 2.8 g/dL (ref 2.2–3.9)
M-Spike, %: 0.3 g/dL — ABNORMAL HIGH
Total Protein ELP: 6.3 g/dL (ref 6.0–8.5)

## 2017-07-02 ENCOUNTER — Ambulatory Visit (INDEPENDENT_AMBULATORY_CARE_PROVIDER_SITE_OTHER): Payer: Medicare HMO | Admitting: Vascular Surgery

## 2017-07-02 ENCOUNTER — Encounter (INDEPENDENT_AMBULATORY_CARE_PROVIDER_SITE_OTHER): Payer: Self-pay | Admitting: Vascular Surgery

## 2017-07-02 VITALS — BP 163/80 | HR 90 | Resp 16 | Wt 213.0 lb

## 2017-07-02 DIAGNOSIS — E782 Mixed hyperlipidemia: Secondary | ICD-10-CM

## 2017-07-02 DIAGNOSIS — E1140 Type 2 diabetes mellitus with diabetic neuropathy, unspecified: Secondary | ICD-10-CM

## 2017-07-02 DIAGNOSIS — I82433 Acute embolism and thrombosis of popliteal vein, bilateral: Secondary | ICD-10-CM | POA: Diagnosis not present

## 2017-07-02 DIAGNOSIS — K219 Gastro-esophageal reflux disease without esophagitis: Secondary | ICD-10-CM

## 2017-07-02 DIAGNOSIS — I10 Essential (primary) hypertension: Secondary | ICD-10-CM

## 2017-07-02 DIAGNOSIS — I82509 Chronic embolism and thrombosis of unspecified deep veins of unspecified lower extremity: Secondary | ICD-10-CM | POA: Insufficient documentation

## 2017-07-02 MED ORDER — APIXABAN 5 MG PO TABS: 5.0000 mg | ORAL_TABLET | Freq: Two times a day (BID) | ORAL | 3 refills | Status: DC

## 2017-07-02 NOTE — Progress Notes (Signed)
MRN : [id number]  Victor Dixon is a 76 y.o. (1941-07-31) male who presents with chief complaint of  Chief Complaint  Patient presents with  . Follow-up  .  History of Present Illness: The patient presents to the office for evaluation of DVT.  DVT was identified at [facility] by Duplex ultrasound.  The initial symptoms were pain and swelling in the lower extremity.  The patient notes the leg continues to be very painful with dependency and swells quite a bite.  Symptoms are much better with elevation.  The patient notes minimal edema in the morning which steadily worsens throughout the day.    The patient has not been using compression therapy at this point.  No SOB or pleuritic chest pains.  No cough or hemoptysis.  No blood per rectum or blood in any sputum.  No excessive bruising per the patient.   Current Meds  Medication Sig  . apixaban (ELIQUIS) 5 MG TABS tablet Take 2 tablets (10 mg total) by mouth 2 (two) times daily.  Marland Kitchen aspirin EC 81 MG tablet Take 81 mg by mouth daily.  Marland Kitchen CALCIUM-VITAMIN D PO Take 1 tablet by mouth 2 (two) times daily.  . carvedilol (COREG) 6.25 MG tablet Take 6.25 mg by mouth 2 (two) times daily with a meal.  . cilostazol (PLETAL) 100 MG tablet Take 100 mg by mouth 2 (two) times daily.  . Cyanocobalamin (VITAMIN B 12) 250 MCG LOZG Take 500 mcg by mouth every morning.  . ferrous sulfate 325 (65 FE) MG tablet Take 325 mg by mouth daily with breakfast.  . finasteride (PROSCAR) 5 MG tablet Take 5 mg by mouth daily.  Marland Kitchen gabapentin (NEURONTIN) 300 MG capsule Take 300 mg by mouth 3 (three) times daily.  Marland Kitchen glimepiride (AMARYL) 2 MG tablet Take 2 mg by mouth daily with breakfast.  . metFORMIN (GLUCOPHAGE-XR) 500 MG 24 hr tablet Take 500 mg by mouth 2 (two) times daily with a meal.  . pantoprazole (PROTONIX) 40 MG tablet Take 40 mg by mouth daily.  . predniSONE (DELTASONE) 5 MG tablet 4 tabs po day1; 3 tabs po day2; 2 tabs po day3; 1 tab po day4  . senna (SENOKOT)  8.6 MG tablet Take 1 tablet by mouth daily.  . simvastatin (ZOCOR) 40 MG tablet Take 40 mg by mouth daily.  . [DISCONTINUED] apixaban (ELIQUIS) 5 MG TABS tablet Take 1 tablet (5 mg total) by mouth 2 (two) times daily. To start after finishing the week course of 10 mg twice daily    Past Medical History:  Diagnosis Date  . Anemia   . Diabetes mellitus without complication ([place])   . GERD (gastroesophageal reflux disease)   . Hypertension     Past Surgical History:  Procedure Laterality Date  . APPENDECTOMY    . CAROTID ARTERY ANGIOPLASTY Right   . PERIPHERAL VASCULAR CATHETERIZATION Left 08/15/2016   Procedure: Lower Extremity Angiography;  Surgeon: Katha Cabal, MD;  Location: [facility];  Service: Cardiovascular;  Laterality: Left;  . PERIPHERAL VASCULAR CATHETERIZATION  08/15/2016   Procedure: Lower Extremity Intervention;  Surgeon: Katha Cabal, MD;  Location: [place] [facility];  Service: Cardiovascular;;    Social History Social History  Substance Use Topics  . Smoking status: Former Smoker    Packs/day: 0.50    Years: 30.00    Quit date: 02/22/2007  . Smokeless tobacco: Never Used  . Alcohol use No    Family History Family History  Problem  Relation Age of Onset  . Cancer Sister     No Known Allergies   REVIEW OF SYSTEMS (Negative unless checked)  Constitutional: [] Weight loss  [] Fever  [] Chills Cardiac: [] Chest pain   [] Chest pressure   [] Palpitations   [] Shortness of breath when laying flat   [] Shortness of breath with exertion. Vascular:  [] Pain in legs with walking   [] Pain in legs at rest  [x] History of DVT   [] Phlebitis   [x] Swelling in legs   [] Varicose veins   [] Non-healing ulcers Pulmonary:   [] Uses home oxygen   [] Productive cough   [] Hemoptysis   [] Wheeze  [] COPD   [] Asthma Neurologic:  [] Dizziness   [] Seizures   [] History of stroke   [] History of TIA  [] Aphasia   [] Vissual changes   [] Weakness or numbness in arm   [] Weakness or  numbness in leg Musculoskeletal:   [] Joint swelling   [] Joint pain   [] Low back pain Hematologic:  [] Easy bruising  [] Easy bleeding   [] Hypercoagulable state   [] Anemic Gastrointestinal:  [] Diarrhea   [] Vomiting  [] Gastroesophageal reflux/heartburn   [] Difficulty swallowing. Genitourinary:  [] Chronic kidney disease   [] Difficult urination  [] Frequent urination   [] Blood in urine Skin:  [] Rashes   [] Ulcers  Psychological:  [] History of anxiety   []  History of major depression.  Physical Examination  Vitals:   07/02/17 0926  BP: (!) 163/80  Pulse: 90  Resp: 16  Weight: 213 lb (96.6 kg)   Body mass index is 28.1 kg/m. Gen: WD/WN, NAD Head: [place]/AT, No temporalis wasting.  Ear/Nose/Throat: Hearing grossly intact, nares w/o erythema or drainage Eyes: PER, EOMI, sclera nonicteric.  Neck: Supple, no large masses.   Pulmonary:  Good air movement, no audible wheezing bilaterally, no use of accessory muscles.  Cardiac: RRR, no JVD Vascular: 2-3+ edema bilateral lower extremities Vessel Right Left  Radial Palpable Palpable  PT Not Palpable Not Palpable  DP Not Palpable Not Palpable  Gastrointestinal: Non-distended. No guarding/no peritoneal signs.  Musculoskeletal: M/S 5/5 throughout.  No deformity or atrophy.  Neurologic: CN 2-12 intact. Symmetrical.  Speech is fluent. Motor exam as listed above. Psychiatric: Judgment intact, Mood & affect appropriate for pt's clinical situation. Dermatologic: No rashes or ulcers noted.  No changes consistent with cellulitis. Lymph : No lichenification or skin changes of chronic lymphedema.  CBC Lab Results  Component Value Date   WBC 8.8 06/27/2017   HGB 10.9 (L) 06/27/2017   HCT 33.9 (L) 06/27/2017   MCV 83.0 06/27/2017   PLT 140 (L) 06/27/2017    BMET    Component Value Date/Time   NA 138 06/27/2017 1043   NA 139 08/23/2013 0231   K 3.7 06/27/2017 1043   K 3.7 08/23/2013 0231   CL 100 (L) 06/27/2017 1043   CL 104 08/23/2013 0231   CO2  30 06/27/2017 1043   CO2 31 08/23/2013 0231   GLUCOSE 102 (H) 06/27/2017 1043   GLUCOSE 133 (H) 08/23/2013 0231   BUN 11 06/27/2017 1043   BUN 9 08/23/2013 0231   CREATININE 0.62 06/27/2017 1043   CREATININE 0.79 08/23/2013 0231   CALCIUM 9.4 06/27/2017 1043   CALCIUM 8.5 08/23/2013 0231   GFRNONAA >60 06/27/2017 1043   GFRNONAA >60 08/23/2013 0231   GFRAA >60 06/27/2017 1043   GFRAA >60 08/23/2013 0231   Estimated Creatinine Clearance: 99.2 mL/min (by C-G formula based on SCr of 0.62 mg/dL).  COAG Lab Results  Component Value Date   INR 0.96 03/14/2017   INR  1.1 08/23/2013    Radiology US Venous Img Lower Bilateral  Result Date: 06/27/2017 CLINICAL DATA:  Anemia. Monoclonal gammopathy. Bilateral lower extremity edema. EXAM: BILATERAL LOWER EXTREMITY VENOUS DOPPLER ULTRASOUND TECHNIQUE: Gray-scale sonography with graded compression, as well as color Doppler and duplex ultrasound were performed to evaluate the lower extremity deep venous systems from the level of the common femoral vein and including the common femoral, femoral, profunda femoral, popliteal and calf veins including the posterior tibial, peroneal and gastrocnemius veins when visible. The superficial great saphenous vein was also interrogated. Spectral Doppler was utilized to evaluate flow at rest and with distal augmentation maneuvers in the common femoral, femoral and popliteal veins. COMPARISON:  None. FINDINGS: RIGHT LOWER EXTREMITY Common Femoral Vein: No evidence of thrombus. Normal compressibility, respiratory phasicity and response to augmentation. Saphenofemoral Junction: No evidence of thrombus. Normal compressibility and flow on color Doppler imaging. Profunda Femoral Vein: No evidence of thrombus. Normal compressibility and flow on color Doppler imaging. Femoral Vein: Duplicated femoral veins with occlusive thrombus in 1 of the right femoral veins. Popliteal Vein: No evidence of thrombus. Normal compressibility,  respiratory phasicity and response to augmentation. Calf Veins: No evidence of thrombus. Normal compressibility and flow on color Doppler imaging. Superficial Great Saphenous Vein: No evidence of thrombus. Normal compressibility and flow on color Doppler imaging. Venous Reflux:  None. Other Findings:  None. LEFT LOWER EXTREMITY Common Femoral Vein: No evidence of thrombus. Normal compressibility, respiratory phasicity and response to augmentation. Saphenofemoral Junction: No evidence of thrombus. Normal compressibility and flow on color Doppler imaging. Profunda Femoral Vein: No evidence of thrombus. Normal compressibility and flow on color Doppler imaging. Femoral Vein: Duplicated femoral veins with occlusive thrombus in 1 of the femoral veins. Popliteal Vein: Partially occlusive thrombus. Calf Veins: No evidence of thrombus. Normal compressibility and flow on color Doppler imaging. Superficial Great Saphenous Vein: No evidence of thrombus. Normal compressibility and flow on color Doppler imaging. Venous Reflux:  None. Other Findings:  None. IMPRESSION: Positive study for bilateral DVTs. 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 Partial occlusive thrombus left popliteal vein. Electronically Signed   By: Marcello Moores  Register   On: 06/27/2017 16:03     Assessment/Plan 1. Deep vein thrombosis (DVT) of popliteal vein of both lower extremities, unspecified chronicity (HCC) Recommend:   No surgery or intervention at this point in time.  IVC filter is not indicated at present.  Patient's duplex ultrasound of the venous system shows DVT in  the popliteal veins.  The patient is initiated on anticoagulation   Elevation was stressed, use of a recliner was discussed.  I have had a long discussion with the patient regarding DVT and post phlebitic changes such as swelling and why it  causes symptoms such as pain.  The patient will wear graduated compression  stockings class 1 (20-30 mmHg), beginning after three full days of anticoagulation, on a daily basis a prescription was given. The patient will  beginning wearing the stockings first thing in the morning and removing them in the evening. The patient is instructed specifically not to sleep in the stockings.  In addition, behavioral modification including elevation during the day and avoidance of prolonged dependency will be initiated.    The patient will continue anticoagulation for now as there have not been any problems or complications at this point.   - VAS [place] LOWER EXTREMITY VENOUS (DVT); Future  2. Essential hypertension Continue antihypertensive medications as already ordered, these medications have been  reviewed and there are no changes at this time.   3. Gastroesophageal reflux disease without esophagitis Continue PPI as already ordered, these medications have been reviewed and there are no changes at this time.   4. Type 2 diabetes mellitus with diabetic neuropathy, without long-term current use of insulin ([place]) Continue hypoglycemic medications as already ordered, these medications have been reviewed and there are no changes at this time.  Hgb A1C to be monitored as already arranged by primary service   5. Mixed hyperlipidemia Continue statin as ordered and reviewed, no changes at this time     Hortencia Pilar, MD  07/02/2017 8:49 PM

## 2017-07-09 ENCOUNTER — Other Ambulatory Visit (INDEPENDENT_AMBULATORY_CARE_PROVIDER_SITE_OTHER): Payer: Self-pay | Admitting: Vascular Surgery

## 2017-07-09 DIAGNOSIS — I779 Disorder of arteries and arterioles, unspecified: Secondary | ICD-10-CM

## 2017-07-09 DIAGNOSIS — I739 Peripheral vascular disease, unspecified: Principal | ICD-10-CM

## 2017-07-12 ENCOUNTER — Ambulatory Visit (INDEPENDENT_AMBULATORY_CARE_PROVIDER_SITE_OTHER): Payer: Medicare HMO | Admitting: Vascular Surgery

## 2017-07-12 ENCOUNTER — Encounter (INDEPENDENT_AMBULATORY_CARE_PROVIDER_SITE_OTHER): Payer: Medicare HMO

## 2017-11-01 ENCOUNTER — Ambulatory Visit (INDEPENDENT_AMBULATORY_CARE_PROVIDER_SITE_OTHER): Payer: Medicare HMO

## 2017-11-01 ENCOUNTER — Ambulatory Visit (INDEPENDENT_AMBULATORY_CARE_PROVIDER_SITE_OTHER): Payer: Medicare HMO | Admitting: Vascular Surgery

## 2017-11-01 ENCOUNTER — Encounter (INDEPENDENT_AMBULATORY_CARE_PROVIDER_SITE_OTHER): Payer: Self-pay | Admitting: Vascular Surgery

## 2017-11-01 VITALS — BP 184/75 | HR 85 | Resp 16 | Ht 69.0 in | Wt 230.0 lb

## 2017-11-01 DIAGNOSIS — I10 Essential (primary) hypertension: Secondary | ICD-10-CM | POA: Diagnosis not present

## 2017-11-01 DIAGNOSIS — K219 Gastro-esophageal reflux disease without esophagitis: Secondary | ICD-10-CM | POA: Diagnosis not present

## 2017-11-01 DIAGNOSIS — E782 Mixed hyperlipidemia: Secondary | ICD-10-CM

## 2017-11-01 DIAGNOSIS — I82433 Acute embolism and thrombosis of popliteal vein, bilateral: Secondary | ICD-10-CM

## 2017-11-01 DIAGNOSIS — E1140 Type 2 diabetes mellitus with diabetic neuropathy, unspecified: Secondary | ICD-10-CM | POA: Diagnosis not present

## 2017-11-01 MED ORDER — APIXABAN 5 MG PO TABS: 5.0000 mg | ORAL_TABLET | Freq: Two times a day (BID) | ORAL | 3 refills | Status: DC

## 2017-11-01 NOTE — Progress Notes (Signed)
MRN : [id number]  Victor Dixon is a 76 y.o. (1941/04/14) male who presents with chief complaint of  Chief Complaint  Patient presents with  . Follow-up    DVT u/s f/u  .  History of Present Illness:  The patient presents to the office for evaluation of DVT.  DVT was identified at [facility] by Duplex ultrasound.  The initial symptoms were pain and swelling in the lower extremity.  The patient notes the leg continues to be very painful with dependency and swells quite a bite.  Symptoms are much better with elevation.  The patient notes minimal edema in the morning which steadily worsens throughout the day.    The patient has not been using compression therapy at this point.  No SOB or pleuritic chest pains.  No cough or hemoptysis.  No blood per rectum or blood in any sputum.  No excessive bruising per the patient.       Current Meds  Medication Sig  . albuterol (PROVENTIL HFA) 108 (90 Base) MCG/ACT inhaler Inhale into the lungs.  Marland Kitchen apixaban (ELIQUIS) 5 MG TABS tablet Take 1 tablet (5 mg total) by mouth 2 (two) times daily.  Marland Kitchen aspirin EC 81 MG tablet Take 81 mg by mouth daily.  . Calcium Carbonate-Vitamin D (CALCIUM HIGH POTENCY/VITAMIN D) 600-200 MG-UNIT TABS Take by mouth.  . carvedilol (COREG) 6.25 MG tablet Take 6.25 mg by mouth 2 (two) times daily with a meal.  . cilostazol (PLETAL) 100 MG tablet Take 100 mg by mouth 2 (two) times daily.  . Cyanocobalamin (VITAMIN B 12) 250 MCG LOZG Take 500 mcg by mouth every morning.  . ferrous sulfate 325 (65 FE) MG tablet Take 325 mg by mouth daily with breakfast.  . finasteride (PROSCAR) 5 MG tablet Take 5 mg by mouth daily.  Marland Kitchen gabapentin (NEURONTIN) 300 MG capsule Take 300 mg by mouth 3 (three) times daily.  Marland Kitchen glimepiride (AMARYL) 2 MG tablet Take 2 mg by mouth daily with breakfast.  . losartan-hydrochlorothiazide (HYZAAR) 100-25 MG tablet Take by mouth.  . metFORMIN (GLUCOPHAGE-XR) 500 MG 24 hr tablet Take 500 mg by mouth 2 (two)  times daily with a meal.  . pantoprazole (PROTONIX) 40 MG tablet Take 40 mg by mouth daily.  . predniSONE (DELTASONE) 5 MG tablet 4 tabs po day1; 3 tabs po day2; 2 tabs po day3; 1 tab po day4  . senna (SENOKOT) 8.6 MG tablet Take 1 tablet by mouth daily.  . simvastatin (ZOCOR) 40 MG tablet Take 40 mg by mouth daily.  . [DISCONTINUED] apixaban (ELIQUIS) 5 MG TABS tablet Take 2 tablets (10 mg total) by mouth 2 (two) times daily.  . [DISCONTINUED] apixaban (ELIQUIS) 5 MG TABS tablet Take 1 tablet (5 mg total) by mouth 2 (two) times daily.    Past Medical History:  Diagnosis Date  . Anemia   . Diabetes mellitus without complication ([place])   . GERD (gastroesophageal reflux disease)   . Hypertension     Past Surgical History:  Procedure Laterality Date  . APPENDECTOMY    . CAROTID ARTERY ANGIOPLASTY Right   . PERIPHERAL VASCULAR CATHETERIZATION Left 08/15/2016   Procedure: Lower Extremity Angiography;  Surgeon: Katha Cabal, MD;  Location: [facility];  Service: Cardiovascular;  Laterality: Left;  . PERIPHERAL VASCULAR CATHETERIZATION  08/15/2016   Procedure: Lower Extremity Intervention;  Surgeon: Katha Cabal, MD;  Location: [facility];  Service: Cardiovascular;;    Social History Social History  Tobacco Use  . Smoking status: Former Smoker    Packs/day: 0.50    Years: 30.00    Pack years: 15.00    Last attempt to quit: 02/22/2007    Years since quitting: 10.6  . Smokeless tobacco: Never Used  Substance Use Topics  . Alcohol use: No  . Drug use: No    Family History Family History  Problem Relation Age of Onset  . Cancer Sister     No Known Allergies   REVIEW OF SYSTEMS (Negative unless checked)  Constitutional: [] Weight loss  [] Fever  [] Chills Cardiac: [] Chest pain   [] Chest pressure   [] Palpitations   [] Shortness of breath when laying flat   [] Shortness of breath with exertion. Vascular:  [] Pain in legs with walking   [] Pain in legs at  rest  [x] History of DVT   [] Phlebitis   [] Swelling in legs   [] Varicose veins   [] Non-healing ulcers Pulmonary:   [] Uses home oxygen   [] Productive cough   [] Hemoptysis   [] Wheeze  [] COPD   [] Asthma Neurologic:  [] Dizziness   [] Seizures   [] History of stroke   [] History of TIA  [] Aphasia   [] Vissual changes   [] Weakness or numbness in arm   [] Weakness or numbness in leg Musculoskeletal:   [] Joint swelling   [] Joint pain   [] Low back pain Hematologic:  [] Easy bruising  [] Easy bleeding   [] Hypercoagulable state   [] Anemic Gastrointestinal:  [] Diarrhea   [] Vomiting  [] Gastroesophageal reflux/heartburn   [] Difficulty swallowing. Genitourinary:  [] Chronic kidney disease   [] Difficult urination  [] Frequent urination   [] Blood in urine Skin:  [] Rashes   [] Ulcers  Psychological:  [] History of anxiety   []  History of major depression.  Physical Examination  Vitals:   11/01/17 1504  BP: (!) 184/75  Pulse: 85  Resp: 16  Weight: 104.3 kg (230 lb)  Height: 5\' 9"  (1.753 m)   Body mass index is 33.97 kg/m. Gen: WD/WN, NAD Head: [place]/AT, No temporalis wasting.  Ear/Nose/Throat: Hearing grossly intact, nares w/o erythema or drainage Eyes: PER, EOMI, sclera nonicteric.  Neck: Supple, no large masses.   Pulmonary:  Good air movement, no audible wheezing bilaterally, no use of accessory muscles.  Cardiac: RRR, no JVD Vascular: scattered varicosities present bilaterally.  Mild venous stasis changes to the legs bilaterally.  2+ soft pitting edema Vessel Right Left  Radial Palpable Palpable  Ulnar Palpable Palpable  PT Not Palpable Not Palpable  DP Not Palpable Not Palpable  Gastrointestinal: Non-distended. No guarding/no peritoneal signs.  Musculoskeletal: M/S 5/5 throughout.  No deformity or atrophy.  Neurologic: CN 2-12 intact. Symmetrical.  Speech is fluent. Motor exam as listed above. Psychiatric: Judgment intact, Mood & affect appropriate for pt's clinical situation. Dermatologic: venous rashes  no ulcers noted.  No changes consistent with cellulitis. Lymph : No lichenification or skin changes of chronic lymphedema.  CBC Lab Results  Component Value Date   WBC 8.8 06/27/2017   HGB 10.9 (L) 06/27/2017   HCT 33.9 (L) 06/27/2017   MCV 83.0 06/27/2017   PLT 140 (L) 06/27/2017    BMET    Component Value Date/Time   NA 138 06/27/2017 1043   NA 139 08/23/2013 0231   K 3.7 06/27/2017 1043   K 3.7 08/23/2013 0231   CL 100 (L) 06/27/2017 1043   CL 104 08/23/2013 0231   CO2 30 06/27/2017 1043   CO2 31 08/23/2013 0231   GLUCOSE 102 (H) 06/27/2017 1043   GLUCOSE 133 (H) 08/23/2013 0231  BUN 11 06/27/2017 1043   BUN 9 08/23/2013 0231   CREATININE 0.62 06/27/2017 1043   CREATININE 0.79 08/23/2013 0231   CALCIUM 9.4 06/27/2017 1043   CALCIUM 8.5 08/23/2013 0231   GFRNONAA >60 06/27/2017 1043   GFRNONAA >60 08/23/2013 0231   GFRAA >60 06/27/2017 1043   GFRAA >60 08/23/2013 0231   CrCl cannot be calculated (Patient's most recent lab result is older than the maximum 21 days allowed.).  COAG Lab Results  Component Value Date   INR 0.96 03/14/2017   INR 1.1 08/23/2013    Radiology No results found.  Assessment/Plan 1. Deep vein thrombosis (DVT) of popliteal vein of both lower extremities, unspecified chronicity (HCC) No surgery or intervention at this point in time.    I have had a long discussion with the patient regarding venous insufficiency and why it  causes symptoms. I have discussed with the patient the chronic skin changes that accompany venous insufficiency and the long term sequela such as infection and ulceration.  Patient will begin wearing graduated compression stockings class 1 (20-30 mmHg) or compression wraps on a daily basis a prescription was given. The patient will put the stockings on first thing in the morning and removing them in the evening. The patient is instructed specifically not to sleep in the stockings.    In addition, behavioral modification  including several periods of elevation of the lower extremities during the day will be continued. I have demonstrated that proper elevation is a position with the ankles at heart level.  The patient is instructed to begin routine exercise, especially walking on a daily basis  Following the review of the ultrasound the patient will follow up in 12 months to reassess the degree of swelling and the control that graduated compression stockings or compression wraps  is offering.   The patient can be assessed for a Lymph Pump at that time  2. Essential hypertension Continue antihypertensive medications as already ordered, these medications have been reviewed and there are no changes at this time.   3. Gastroesophageal reflux disease without esophagitis Continue antihypertensive medications as already ordered, these medications have been reviewed and there are no changes at this time.   4. Type 2 diabetes mellitus with diabetic neuropathy, without long-term current use of insulin ([place]) Continue hypoglycemic medications as already ordered, these medications have been reviewed and there are no changes at this time.  Hgb A1C to be monitored as already arranged by primary service   5. Mixed hyperlipidemia Continue statin as ordered and reviewed, no changes at this time      Hortencia Pilar, MD  11/01/2017 3:40 PM

## 2017-11-04 ENCOUNTER — Encounter (INDEPENDENT_AMBULATORY_CARE_PROVIDER_SITE_OTHER): Payer: Self-pay | Admitting: Vascular Surgery

## 2017-11-05 ENCOUNTER — Other Ambulatory Visit (INDEPENDENT_AMBULATORY_CARE_PROVIDER_SITE_OTHER): Payer: Self-pay | Admitting: Vascular Surgery

## 2017-12-26 ENCOUNTER — Other Ambulatory Visit: Payer: Self-pay | Admitting: Hematology and Oncology

## 2017-12-26 ENCOUNTER — Inpatient Hospital Stay: Payer: Medicare HMO | Attending: Hematology and Oncology

## 2017-12-26 ENCOUNTER — Inpatient Hospital Stay (HOSPITAL_BASED_OUTPATIENT_CLINIC_OR_DEPARTMENT_OTHER): Payer: Medicare HMO | Admitting: Hematology and Oncology

## 2017-12-26 ENCOUNTER — Encounter: Payer: Self-pay | Admitting: Hematology and Oncology

## 2017-12-26 VITALS — BP 146/70 | HR 86 | Temp 95.9°F | Resp 18 | Wt 228.2 lb

## 2017-12-26 DIAGNOSIS — M353 Polymyalgia rheumatica: Secondary | ICD-10-CM

## 2017-12-26 DIAGNOSIS — I82433 Acute embolism and thrombosis of popliteal vein, bilateral: Secondary | ICD-10-CM | POA: Diagnosis not present

## 2017-12-26 DIAGNOSIS — D472 Monoclonal gammopathy: Secondary | ICD-10-CM

## 2017-12-26 DIAGNOSIS — I739 Peripheral vascular disease, unspecified: Secondary | ICD-10-CM | POA: Insufficient documentation

## 2017-12-26 DIAGNOSIS — G8929 Other chronic pain: Secondary | ICD-10-CM

## 2017-12-26 DIAGNOSIS — D638 Anemia in other chronic diseases classified elsewhere: Secondary | ICD-10-CM | POA: Insufficient documentation

## 2017-12-26 DIAGNOSIS — M545 Low back pain: Secondary | ICD-10-CM | POA: Diagnosis not present

## 2017-12-26 DIAGNOSIS — D6489 Other specified anemias: Secondary | ICD-10-CM

## 2017-12-26 DIAGNOSIS — R600 Localized edema: Secondary | ICD-10-CM

## 2017-12-26 LAB — COMPREHENSIVE METABOLIC PANEL
ALT: 31 U/L (ref 17–63)
AST: 55 U/L — ABNORMAL HIGH (ref 15–41)
Albumin: 4.3 g/dL (ref 3.5–5.0)
Alkaline Phosphatase: 62 U/L (ref 38–126)
Anion gap: 8 (ref 5–15)
BUN: 16 mg/dL (ref 6–20)
CO2: 29 mmol/L (ref 22–32)
Calcium: 9.5 mg/dL (ref 8.9–10.3)
Chloride: 95 mmol/L — ABNORMAL LOW (ref 101–111)
Creatinine, Ser: 0.9 mg/dL (ref 0.61–1.24)
GFR calc Af Amer: >60 mL/min (ref >60)
GFR calc non Af Amer: >60 mL/min (ref >60)
Glucose, Bld: 117 mg/dL — ABNORMAL HIGH (ref 65–99)
Potassium: 4 mmol/L (ref 3.5–5.1)
Sodium: 132 mmol/L — ABNORMAL LOW (ref 135–145)
Total Bilirubin: 0.7 mg/dL (ref 0.3–1.2)
Total Protein: 8 g/dL (ref 6.5–8.1)

## 2017-12-26 LAB — CBC WITH DIFFERENTIAL/PLATELET
Basophils Absolute: 0 10*3/uL (ref 0–0.1)
Basophils Relative: 1 %
Eosinophils Absolute: 0.1 10*3/uL (ref 0–0.7)
Eosinophils Relative: 2 %
HCT: 32.8 % — ABNORMAL LOW (ref 40.0–52.0)
Hemoglobin: 10.7 g/dL — ABNORMAL LOW (ref 13.0–18.0)
Lymphocytes Relative: 19 %
Lymphs Abs: 1.3 10*3/uL (ref 1.0–3.6)
MCH: 28.8 pg (ref 26.0–34.0)
MCHC: 32.7 g/dL (ref 32.0–36.0)
MCV: 88.1 fL (ref 80.0–100.0)
Monocytes Absolute: 0.5 10*3/uL (ref 0.2–1.0)
Monocytes Relative: 8 %
Neutro Abs: 4.7 10*3/uL (ref 1.4–6.5)
Neutrophils Relative %: 70 %
Platelets: 276 10*3/uL (ref 150–440)
RBC: 3.73 MIL/uL — ABNORMAL LOW (ref 4.40–5.90)
RDW: 14.8 % — ABNORMAL HIGH (ref 11.5–14.5)
WBC: 6.6 10*3/uL (ref 3.8–10.6)

## 2017-12-26 LAB — FERRITIN: Ferritin: 305 ng/mL (ref 24–336)

## 2017-12-26 LAB — SEDIMENTATION RATE: Sed Rate: 56 mm/hr — ABNORMAL HIGH (ref 0–20)

## 2017-12-26 NOTE — Progress Notes (Signed)
[facility] day:  12/26/2017  Chief Complaint: Victor Dixon is a 77 y.o. male with anemia of chronic disease, monoclonal gammopathy of unknown significance (MGUS), and new bilateral lower extremity DVTs who is seen for 6 month assessment.  HPI:  The patient was last seen in the medical oncology clinic on 06/27/2017.  At that time, he felt good.  He had gained weight.  Exam revealed bilateral lower extremity edema.  Bilateral lower extremity duplex on 06/27/2017 revealed bilateral DVTs.  There was bilateral femoral venous duplication with occlusive thrombosis in a single femoral vein bilaterally.  There was partial occlusive thrombus in the left popliteal vein.  Patient was seen in the  ER on 06/27/2017.  He was started on Eliquis.  He was seen in follow-up by Dr. Delana Meyer on 07/02/2017 and 11/01/2017. Patient notes that vascular wants patient to remain on lifelong anticoagulation.   In the interim, patient has continuing claudication pain in his bilateral lower extremities. There is no familial history of clotting disorders. Patient has chronic back pain. He needs to have back surgery, however he notes that he has put this off citing the fact that he "lost his wife last year and has no one to take care of him". Patient denies chest pain, shortness of breath, and palpitations.   He is eating well. His weight has decreased 2 pounds. He denies pain in the clinic today.    Past Medical History:  Diagnosis Date  . Anemia   . Diabetes mellitus without complication ([place])   . GERD (gastroesophageal reflux disease)   . Hypertension     Past Surgical History:  Procedure Laterality Date  . APPENDECTOMY    . CAROTID ARTERY ANGIOPLASTY Right   . PERIPHERAL VASCULAR CATHETERIZATION Left 08/15/2016   Procedure: Lower Extremity Angiography;  Surgeon: Katha Cabal, MD;  Location: [facility];  Service: Cardiovascular;  Laterality: Left;  .  PERIPHERAL VASCULAR CATHETERIZATION  08/15/2016   Procedure: Lower Extremity Intervention;  Surgeon: Katha Cabal, MD;  Location: [facility];  Service: Cardiovascular;;    Family History  Problem Relation Age of Onset  . Cancer Sister     Social History:  reports that he quit smoking about 10 years ago. He has a 15.00 pack-year smoking history. he has never used smokeless tobacco. He reports that he does not drink alcohol or use drugs.  He smoked a 1/2 pack a day since age 33.  He stopped smoking 10 years ago.  The patient's wife died in February 01, 2017.  The patient is alone today.  Allergies: No Known Allergies  Current Medications: Current Outpatient Medications  Medication Sig Dispense Refill  . albuterol (PROVENTIL HFA) 108 (90 Base) MCG/ACT inhaler Inhale into the lungs.    Marland Kitchen apixaban (ELIQUIS) 5 MG TABS tablet Take 1 tablet (5 mg total) by mouth 2 (two) times daily. 60 tablet 3  . Calcium Carbonate-Vitamin D (CALCIUM HIGH POTENCY/VITAMIN D) 600-200 MG-UNIT TABS Take by mouth.    . carvedilol (COREG) 6.25 MG tablet Take 6.25 mg by mouth 2 (two) times daily with a meal.    . cilostazol (PLETAL) 100 MG tablet Take 100 mg by mouth 2 (two) times daily.    . Cyanocobalamin (VITAMIN B 12) 250 MCG LOZG Take 500 mcg by mouth every morning.    Marland Kitchen ELIQUIS 5 MG TABS tablet TAKE 1 TABLET BY MOUTH TWICE DAILY 60 tablet 3  . ferrous sulfate 325 (65 FE)  MG tablet Take 325 mg by mouth daily with breakfast.    . finasteride (PROSCAR) 5 MG tablet Take 5 mg by mouth daily.    Marland Kitchen gabapentin (NEURONTIN) 300 MG capsule Take 300 mg by mouth 3 (three) times daily.    Marland Kitchen glimepiride (AMARYL) 2 MG tablet Take 2 mg by mouth daily with breakfast.    . losartan-hydrochlorothiazide (HYZAAR) 100-25 MG tablet Take by mouth.    . metFORMIN (GLUCOPHAGE-XR) 500 MG 24 hr tablet Take 500 mg by mouth 2 (two) times daily with a meal.    . pantoprazole (PROTONIX) 40 MG tablet Take 40 mg by mouth daily.    Marland Kitchen senna  (SENOKOT) 8.6 MG tablet Take 1 tablet by mouth daily.    . simvastatin (ZOCOR) 40 MG tablet Take 40 mg by mouth daily.     No current facility-administered medications for this visit.    Facility-Administered Medications Ordered in Other Visits  Medication Dose Route Frequency Provider Last Rate Last Dose  . heparin lock flush 100 unit/mL  500 Units Intravenous Once Monia Sabal, PA-C        Review of Systems:  GENERAL:  Feels "ok".  No fevers or sweats.  Weight loss of 40-45 pounds since 12/2016.  Weight down 2 pounds since last visit.  PERFORMANCE STATUS (ECOG):  1 HEENT:  No visual changes, sore throat, mouth sores or tenderness. Lungs: No shortness of breath or cough.  No hemoptysis. Cardiac:  No chest pain, palpitations, orthopnea, or PND. GI:  No nausea, vomiting, diarrhea, constipation, melena or hematochezia. GU:  No urgency, frequency, dysuria, or hematuria.  Nocturia (chronic). Musculoskeletal:  Chronic back pain.  No muscle tenderness. Extremities:  Claudication in legs.  Lower extremity swelling. Skin:  No rashes or skin changes. Neuro:  No headache, numbness or weakness, balance or coordination issues. Endocrine:  Diabetes.  No thyroid issues, hot flashes or night sweats. Psych:  No mood changes, depression or anxiety. Pain:  No focal pain. Review of systems:  All other systems reviewed and found to be negative.  Physical Exam: Blood pressure (!) 146/70, pulse 86, temperature (!) 95.9 F (35.5 C), temperature source Tympanic, resp. rate 18, weight 228 lb 2.8 oz (103.5 kg). GENERAL:  Well developed, well nourished, gentleman sitting hunched over in the exam room in no acute distress. MENTAL STATUS:  Alert and oriented to person, place and time. HEAD:  Wearing a black cap. Short hair.  Normocephalic, atraumatic, face symmetric, no Cushingoid features. EYES:  Glasses.  Brown eyes.  Pupils equal round and reactive to light and accomodation.  No conjunctivitis or scleral  icterus. ENT:  Oropharynx clear without lesion.  Tongue normal.  Dentures.  Mucous membranes moist.  RESPIRATORY:  Clear to auscultation without rales, wheezes or rhonchi. CARDIOVASCULAR:  Regular rate and rhythm without murmur, rub or gallop. ABDOMEN:  Soft, non-tender, with active bowel sounds, and no hepatosplenomegaly.  No masses. SKIN:  No rashes, ulcers or lesions. EXTREMITIES: Support stocking.  No edema .  No skin discoloration or tenderness.  No palpable cords. LYMPH NODES: No palpable cervical, supraclavicular, axillary or inguinal adenopathy  NEUROLOGICAL: Unremarkable. PSYCH:  Appropriate.   Appointment on 12/26/2017  Component Date Value Ref Range Status  . Sodium 12/26/2017 132* 135 - 145 mmol/L Final  . Potassium 12/26/2017 4.0  3.5 - 5.1 mmol/L Final  . Chloride 12/26/2017 95* 101 - 111 mmol/L Final  . CO2 12/26/2017 29  22 - 32 mmol/L Final  . Glucose, Bld 12/26/2017 117*  65 - 99 mg/dL Final  . BUN 12/26/2017 16  6 - 20 mg/dL Final  . Creatinine, Ser 12/26/2017 0.90  0.61 - 1.24 mg/dL Final  . Calcium 12/26/2017 9.5  8.9 - 10.3 mg/dL Final  . Total Protein 12/26/2017 8.0  6.5 - 8.1 g/dL Final  . Albumin 12/26/2017 4.3  3.5 - 5.0 g/dL Final  . AST 12/26/2017 55* 15 - 41 U/L Final  . ALT 12/26/2017 31  17 - 63 U/L Final  . Alkaline Phosphatase 12/26/2017 62  38 - 126 U/L Final  . Total Bilirubin 12/26/2017 0.7  0.3 - 1.2 mg/dL Final  . GFR calc non Af Amer 12/26/2017 >60  >60 mL/min Final  . GFR calc Af Amer 12/26/2017 >60  >60 mL/min Final   Comment: (NOTE) The eGFR has been calculated using the CKD EPI equation. This calculation has not been validated in all clinical situations. eGFR's persistently <60 mL/min signify possible Chronic Kidney Disease.   Georgiann Hahn gap 12/26/2017 8  5 - 15 Final   Performed at [facility], [address], [place], [place] [address]  . WBC 12/26/2017 6.6  3.8 - 10.6 K/uL Final  . RBC 12/26/2017 3.73* 4.40 - 5.90 MIL/uL  Final  . Hemoglobin 12/26/2017 10.7* 13.0 - 18.0 g/dL Final  . HCT 12/26/2017 32.8* 40.0 - 52.0 % Final  . MCV 12/26/2017 88.1  80.0 - 100.0 fL Final  . MCH 12/26/2017 28.8  26.0 - 34.0 pg Final  . MCHC 12/26/2017 32.7  32.0 - 36.0 g/dL Final  . RDW 12/26/2017 14.8* 11.5 - 14.5 % Final  . Platelets 12/26/2017 276  150 - 440 K/uL Final  . Neutrophils Relative % 12/26/2017 70  % Final  . Neutro Abs 12/26/2017 4.7  1.4 - 6.5 K/uL Final  . Lymphocytes Relative 12/26/2017 19  % Final  . Lymphs Abs 12/26/2017 1.3  1.0 - 3.6 K/uL Final  . Monocytes Relative 12/26/2017 8  % Final  . Monocytes Absolute 12/26/2017 0.5  0.2 - 1.0 K/uL Final  . Eosinophils Relative 12/26/2017 2  % Final  . Eosinophils Absolute 12/26/2017 0.1  0 - 0.7 K/uL Final  . Basophils Relative 12/26/2017 1  % Final  . Basophils Absolute 12/26/2017 0.0  0 - 0.1 K/uL Final   Performed at [facility], [address] [place], [place] [address]    Assessment:  Tayvion Lauder is a 77 y.o. male with anemia of chronic disease.  He has had a normocytic anemia since 10/19/2016.  Labs reveal a progressive normocytic anemia since 10/19/2016.  Hematocrit has decreased from 37.9 to 28.5 (hemoglobin 11.7 to 8.4).  He has been on oral iron and B12 x 1 year.  He has a monoclonal gammopathy of unknown significance (MGUS).  Diet is good.  His last colonoscopy was > 5 years ago.  He denies any history of gastric ulcer.  He denies any melena or hematochezia.  Labs on 01/26/2017 revealed a normal B12, RF.  Ferritin was elevated (acute phase reactant). TIBC was 244 (low) c/w anemia of chronic disease.  Reticulocyte count was 2.96%.  CRP was 63.6 (high).   Work-up on 02/21/2017 revealed a 0.3 gm/dL IgG monoclonal protein with kappa light chain specificity.  Free light chain ratio was 1.27 (normal).  24 hour UPEP was negative on 02/27/2017.  Hematocrit was 26.0, hemoglobin 8.2, MCV 81.2, platelets 357,000, WBC 6800 with an ANC of 4400.   Retic was 1.1% (inappropriately low).  Normal  studies included: creatinine, calcium, Coombs, folate, ANA, and immunoglobulins.  Ferritin was 372.  Iron studies included a saturation of 7% and a TIBC of 217 (low).  Sed rate was 119.  Bone survey on 02/22/2017 revealed no lytic or blastic bony lesions. There was multilevel degenerative disc disease throughout the spine.  There was mild symmetric hip joint space narrowing bilaterally consistent with osteoarthritis.  Bone marrow aspirate and biopsy on 03/14/2017 revealed a slightly hypercellular marrow with trilineage hematopoiesis.  There was no significant dyspoiesis.  There was slight plasmacytosis (5%).  Plasma cells displayed staining for kappa and lambda light chians with slight kappa excess.  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  Cytogenetics were normal (46, XY).  SPEP has been followed: 0.6 gm/dL on 01/26/2017, 0.3 on 02/21/2017, 0.3 on 06/27/2017, and 0.5 on 12/26/2017.  He has polymyalgia rheumatica.  With a prednisone pulse, joint pain dramatically improved.  ESR and CRP normalized after initiation of prednisone.  Anemia has improved.    He has peripheral vascular disease and is followed by vascular surgery.  Bilateral lower extremity duplex on 06/27/2017 revealed bilateral DVTs.  There was bilateral femoral venous duplication with occlusive thrombosis in a single femoral vein bilaterally.  There was partial occlusive thrombus left popliteal vein.  He is on Eliquis.  Symptomatically, he feels good.  He has lost 2 pounds.  Patient has continued claudication pain. He is followed by vascular. Exam reveals bilateral lower extremity edema.  Plan: 1.  Labs today:  CBC with diff, CMP, SPEP, ferritin, sed rate. 2.  Hypercoagulable work-up:  Factor V Leiden, prothrombin gene mutation, lupus anticoagulant, anticardiolipin antibodies, beta-2 glycoprotein antibodies. 3.  Discuss bilateral lower extremity  DVTs. 4.  Discuss lifelong anticoagulation. Confirm with vascular the intended plan. Continue Eliquis as prescribed. Online request submitted for co-pay card. Card print out provided to patient.  5.  Discuss monoclonal gammopathy of unknown significance.  Discuss risk of transformation to myeloma or a lymphoproliferative disorder 1%/year.  Discss the plan for follow-up every 6 months. 6.  RTC in 6 months for MD assessment and labs (CBC with diff, CMP, SPEP,ferritin, sed rate).   Honor Loh, NP  12/26/2017, 11:40 AM   I saw and evaluated the patient, participating in the key portions of the service and reviewing pertinent diagnostic studies and records.  I reviewed the nurse practitioner's note and agree with the findings and the plan.  The assessment and plan were discussed with the patient.  Several questions were asked by the patient and answered.   Nolon Stalls, MD 12/26/2017,11:40 AM

## 2017-12-26 NOTE — Progress Notes (Signed)
Patient offers no complaints today. 

## 2017-12-27 LAB — KAPPA/LAMBDA LIGHT CHAINS
Kappa free light chain: 15.2 mg/L (ref 3.3–19.4)
Kappa, lambda light chain ratio: 1.45 (ref 0.26–1.65)
Lambda free light chains: 10.5 mg/L (ref 5.7–26.3)

## 2017-12-27 LAB — PROTEIN ELECTROPHORESIS, SERUM
A/G Ratio: 1.1 (ref 0.7–1.7)
Albumin ELP: 3.9 g/dL (ref 2.9–4.4)
Alpha-1-Globulin: 0.3 g/dL (ref 0.0–0.4)
Alpha-2-Globulin: 0.9 g/dL (ref 0.4–1.0)
Beta Globulin: 1.1 g/dL (ref 0.7–1.3)
Gamma Globulin: 1 g/dL (ref 0.4–1.8)
Globulin, Total: 3.4 g/dL (ref 2.2–3.9)
M-Spike, %: 0.5 g/dL — ABNORMAL HIGH
Total Protein ELP: 7.3 g/dL (ref 6.0–8.5)

## 2017-12-28 LAB — CARDIOLIPIN ANTIBODIES, IGG, IGM, IGA
Anticardiolipin IgA: <9 APL U/mL (ref 0–11)
Anticardiolipin IgG: <9 GPL U/mL (ref 0–14)
Anticardiolipin IgM: <9 MPL U/mL (ref 0–12)

## 2017-12-28 LAB — LUPUS ANTICOAGULANT PANEL
DRVVT: 65.2 s — ABNORMAL HIGH (ref 0.0–47.0)
PTT Lupus Anticoagulant: 37.2 s (ref 0.0–51.9)

## 2017-12-28 LAB — DRVVT CONFIRM: dRVVT Confirm: 1.3 ratio — ABNORMAL HIGH (ref 0.8–1.2)

## 2017-12-28 LAB — DRVVT MIX: dRVVT Mix: 50.8 s — ABNORMAL HIGH (ref 0.0–47.0)

## 2017-12-31 LAB — PROTHROMBIN GENE MUTATION

## 2018-01-01 LAB — BETA-2-GLYCOPROTEIN I ABS, IGG/M/A
Beta-2 Glyco I IgG: <9 GPI IgG units (ref 0–20)
Beta-2-Glycoprotein I IgA: <9 GPI IgA units (ref 0–25)
Beta-2-Glycoprotein I IgM: <9 GPI IgM units (ref 0–32)

## 2018-01-02 LAB — FACTOR 5 LEIDEN

## 2018-06-26 ENCOUNTER — Inpatient Hospital Stay: Payer: Medicare HMO

## 2018-06-26 ENCOUNTER — Inpatient Hospital Stay: Payer: Medicare HMO | Attending: Hematology and Oncology | Admitting: Urgent Care

## 2018-06-26 VITALS — HR 80 | Temp 97.6°F | Resp 18 | Wt 233.7 lb

## 2018-06-26 DIAGNOSIS — D638 Anemia in other chronic diseases classified elsewhere: Secondary | ICD-10-CM | POA: Diagnosis not present

## 2018-06-26 DIAGNOSIS — K5900 Constipation, unspecified: Secondary | ICD-10-CM | POA: Diagnosis not present

## 2018-06-26 DIAGNOSIS — M545 Low back pain: Secondary | ICD-10-CM | POA: Insufficient documentation

## 2018-06-26 DIAGNOSIS — R609 Edema, unspecified: Secondary | ICD-10-CM | POA: Diagnosis not present

## 2018-06-26 DIAGNOSIS — D472 Monoclonal gammopathy: Secondary | ICD-10-CM | POA: Insufficient documentation

## 2018-06-26 DIAGNOSIS — I82433 Acute embolism and thrombosis of popliteal vein, bilateral: Secondary | ICD-10-CM | POA: Diagnosis not present

## 2018-06-26 DIAGNOSIS — E119 Type 2 diabetes mellitus without complications: Secondary | ICD-10-CM | POA: Insufficient documentation

## 2018-06-26 DIAGNOSIS — G8929 Other chronic pain: Secondary | ICD-10-CM | POA: Insufficient documentation

## 2018-06-26 DIAGNOSIS — I739 Peripheral vascular disease, unspecified: Secondary | ICD-10-CM | POA: Diagnosis not present

## 2018-06-26 DIAGNOSIS — M353 Polymyalgia rheumatica: Secondary | ICD-10-CM | POA: Insufficient documentation

## 2018-06-26 DIAGNOSIS — D6489 Other specified anemias: Secondary | ICD-10-CM

## 2018-06-26 LAB — CBC WITH DIFFERENTIAL/PLATELET
Basophils Absolute: 0.1 10*3/uL (ref 0–0.1)
Basophils Relative: 1 %
Eosinophils Absolute: 0.1 10*3/uL (ref 0–0.7)
Eosinophils Relative: 2 %
HCT: 29.7 % — ABNORMAL LOW (ref 40.0–52.0)
Hemoglobin: 9.5 g/dL — ABNORMAL LOW (ref 13.0–18.0)
Lymphocytes Relative: 27 %
Lymphs Abs: 2.2 10*3/uL (ref 1.0–3.6)
MCH: 27.5 pg (ref 26.0–34.0)
MCHC: 32.1 g/dL (ref 32.0–36.0)
MCV: 85.8 fL (ref 80.0–100.0)
Monocytes Absolute: 0.7 10*3/uL (ref 0.2–1.0)
Monocytes Relative: 8 %
Neutro Abs: 4.9 10*3/uL (ref 1.4–6.5)
Neutrophils Relative %: 62 %
Platelets: 311 10*3/uL (ref 150–440)
RBC: 3.46 MIL/uL — ABNORMAL LOW (ref 4.40–5.90)
RDW: 16.1 % — ABNORMAL HIGH (ref 11.5–14.5)
WBC: 8 10*3/uL (ref 3.8–10.6)

## 2018-06-26 LAB — COMPREHENSIVE METABOLIC PANEL
ALT: 16 U/L (ref 0–44)
AST: 27 U/L (ref 15–41)
Albumin: 4.2 g/dL (ref 3.5–5.0)
Alkaline Phosphatase: 48 U/L (ref 38–126)
Anion gap: 12 (ref 5–15)
BUN: 22 mg/dL (ref 8–23)
CO2: 25 mmol/L (ref 22–32)
Calcium: 9.6 mg/dL (ref 8.9–10.3)
Chloride: 99 mmol/L (ref 98–111)
Creatinine, Ser: 0.98 mg/dL (ref 0.61–1.24)
GFR calc Af Amer: >60 mL/min (ref >60)
GFR calc non Af Amer: >60 mL/min (ref >60)
Glucose, Bld: 107 mg/dL — ABNORMAL HIGH (ref 70–99)
Potassium: 4.4 mmol/L (ref 3.5–5.1)
Sodium: 136 mmol/L (ref 135–145)
Total Bilirubin: 0.4 mg/dL (ref 0.3–1.2)
Total Protein: 7.9 g/dL (ref 6.5–8.1)

## 2018-06-26 LAB — SEDIMENTATION RATE: Sed Rate: 62 mm/hr — ABNORMAL HIGH (ref 0–20)

## 2018-06-26 NOTE — Progress Notes (Signed)
[facility] day:  06/26/2018  Chief Complaint: Victor Dixon is a 77 y.o. male with anemia of chronic disease, monoclonal gammopathy of unknown significance (MGUS), and new bilateral lower extremity DVTs who is seen for 6 month assessment.  HPI:  The patient was last seen in the medical oncology clinic on 12/26/2017.  At that time, patient complained of continuing claudication pain in his bilateral lower extremities and chronic back pain.  Patient with need to undergo back surgery, however patient has deferred surgery citing the fact that he "lost his wife last year and has no one to take care of him".  No chest pain, shortness breath, or palpitations.  Eating well; weight down 2 pounds.  Exam revealed bilateral lower extremity edema. Additional labs were ordered.  Additional lab studies on 12/26/2017 revealed normal beta-2 glycoproteins. Cardiolipin antibodies normal.  There was no prothrombin gene mutation identified.  Factor V Leiden negative.  Sedimentation rate was elevated at 56 (0- 20 mm/hr).  Free light chain assay was normal.  SPEP demonstrated an M spike of 0.5 g/dL.  Lupus anticoagulant panel positive.  In the interim, patient is doing well overall. Patient denies that he has experienced any B symptoms. He denies any interval infections. Patient notes that his BILATERAL lower extremities continue to "burn" when he walks, however he denies significant pain and swelling. Of note, patient is followed by vascular Delana Meyer, MD). Patient advising that his last visit with vascular was "good" and that he was asked to return for evaluation in 1 year. Patient utilizes recommended strategies to mitigate his symptoms. He elevates his legs when sitting around at home. Additionally, patient wears anti-embolism stockings during the day. He continues on chronic anticoagulation therapy (apixaban). Patient denies bleeding; no hematochezia, melena, or gross hematuria.  He denies any areas of increased bruising.   Patient complains of continued issues with constipation. Patient takes Pericolace 100 mg twice a day, but continues to experience daily constipation. Patient advises that he maintains an adequate appetite. He is eating well and making a conscious effort to include fiber rich foods into his diet. He adds that he stays adequately hydrated. Weight today is 233 lb 11 oz (106 kg), which compared to his last visit to the clinic, represents a 5 pound increase.    Patient denies pain in the clinic today.   Past Medical History:  Diagnosis Date  . Anemia   . Diabetes mellitus without complication ([place])   . GERD (gastroesophageal reflux disease)   . Hypertension     Past Surgical History:  Procedure Laterality Date  . APPENDECTOMY    . CAROTID ARTERY ANGIOPLASTY Right   . PERIPHERAL VASCULAR CATHETERIZATION Left 08/15/2016   Procedure: Lower Extremity Angiography;  Surgeon: Katha Cabal, MD;  Location: [facility];  Service: Cardiovascular;  Laterality: Left;  . PERIPHERAL VASCULAR CATHETERIZATION  08/15/2016   Procedure: Lower Extremity Intervention;  Surgeon: Katha Cabal, MD;  Location: [facility];  Service: Cardiovascular;;    Family History  Problem Relation Age of Onset  . Cancer Sister     Social History:  reports that he quit smoking about 11 years ago. He has a 15.00 pack-year smoking history. He has never used smokeless tobacco. He reports that he does not drink alcohol or use drugs.  He smoked a 1/2 pack a day since age 39.  He stopped smoking 10 years ago.  The patient's wife died in 02/01/2017.  The  patient is alone today.  Allergies: No Known Allergies  Current Medications: Current Outpatient Medications  Medication Sig Dispense Refill  . apixaban (ELIQUIS) 5 MG TABS tablet Take 1 tablet (5 mg total) by mouth 2 (two) times daily. 60 tablet 3  . aspirin EC 81 MG tablet Take 81 mg by mouth daily.    .  Calcium Carbonate-Vitamin D (CALCIUM HIGH POTENCY/VITAMIN D) 600-200 MG-UNIT TABS Take by mouth.    . carvedilol (COREG) 6.25 MG tablet Take 6.25 mg by mouth 2 (two) times daily with a meal.    . cilostazol (PLETAL) 100 MG tablet Take 100 mg by mouth 2 (two) times daily.    . Cyanocobalamin (VITAMIN B 12) 250 MCG LOZG Take 500 mcg by mouth every morning.    Marland Kitchen ELIQUIS 5 MG TABS tablet TAKE 1 TABLET BY MOUTH TWICE DAILY 60 tablet 3  . ferrous sulfate 325 (65 FE) MG tablet Take 325 mg by mouth daily with breakfast.    . finasteride (PROSCAR) 5 MG tablet Take 5 mg by mouth daily.    Marland Kitchen gabapentin (NEURONTIN) 300 MG capsule Take 300 mg by mouth 3 (three) times daily.    Marland Kitchen glimepiride (AMARYL) 2 MG tablet Take 2 mg by mouth daily with breakfast.    . losartan-hydrochlorothiazide (HYZAAR) 100-25 MG tablet Take by mouth.    . metFORMIN (GLUCOPHAGE-XR) 500 MG 24 hr tablet Take 500 mg by mouth 2 (two) times daily with a meal.    . pantoprazole (PROTONIX) 40 MG tablet Take 40 mg by mouth daily.    . predniSONE (DELTASONE) 1 MG tablet Take 3 tablets by mouth daily.    Marland Kitchen senna (SENOKOT) 8.6 MG tablet Take 1 tablet by mouth daily.    . simvastatin (ZOCOR) 40 MG tablet Take 40 mg by mouth daily.    Marland Kitchen albuterol (PROVENTIL HFA) 108 (90 Base) MCG/ACT inhaler Inhale into the lungs.     No current facility-administered medications for this visit.    Facility-Administered Medications Ordered in Other Visits  Medication Dose Route Frequency Provider Last Rate Last Dose  . heparin lock flush 100 unit/mL  500 Units Intravenous Once Monia Sabal, PA-C        Review of Systems  Constitutional: Negative for diaphoresis, fever, malaise/fatigue and weight loss (weight up 5 pounds).       "I feel good".  HENT: Negative.   Eyes: Negative.   Respiratory: Negative for cough, hemoptysis, sputum production and shortness of breath.   Cardiovascular: Positive for claudication ("burning" with ambulation) and leg swelling.  Negative for chest pain, palpitations, orthopnea and PND.       PVD  Gastrointestinal: Positive for constipation. Negative for abdominal pain, blood in stool, diarrhea, melena, nausea and vomiting.  Genitourinary: Negative for dysuria, frequency, hematuria and urgency.  Musculoskeletal: Positive for back pain (chronic). Negative for falls, joint pain and myalgias.  Skin: Negative for itching and rash.  Neurological: Negative for dizziness, tremors, weakness and headaches.  Endo/Heme/Allergies: Does not bruise/bleed easily.       Diabetes  Psychiatric/Behavioral: Negative for depression, memory loss and suicidal ideas. The patient is not nervous/anxious and does not have insomnia.   All other systems reviewed and are negative.  Performance status (ECOG): 1 - Symptomatic but completely ambulatory  Vital Signs Pulse 80   Temp 97.6 F (36.4 C) (Tympanic)   Resp 18   Wt 233 lb 11 oz (106 kg)   SpO2 99%   BMI 34.51 kg/m   Physical  Exam  Constitutional: He is oriented to person, place, and time and well-developed, well-nourished, and in no distress.  HENT:  Head: Normocephalic and atraumatic.  Eyes: Pupils are equal, round, and reactive to light. EOM are normal. No scleral icterus.  Glasses. Brown eyes.   Neck: Normal range of motion. Neck supple. No tracheal deviation present. No thyromegaly present.  Cardiovascular: Normal rate, regular rhythm and normal heart sounds. Exam reveals no gallop and no friction rub.  No murmur heard. Pulmonary/Chest: Effort normal and breath sounds normal. No respiratory distress. He has no wheezes. He has no rales.  Abdominal: Soft. Bowel sounds are normal. He exhibits no distension. There is no tenderness.  Musculoskeletal: Normal range of motion. He exhibits edema (2+ NP edema to BLE). He exhibits no tenderness.  Lymphadenopathy:    He has no cervical adenopathy.    He has no axillary adenopathy.       Right: No inguinal and no supraclavicular  adenopathy present.       Left: No inguinal and no supraclavicular adenopathy present.  Neurological: He is alert and oriented to person, place, and time.  Skin: Skin is warm and dry. No rash noted. No erythema.  BLE with PVD changes - skin is glossy in appearance.   Psychiatric: Mood, affect and judgment normal.  Nursing note and vitals reviewed.   Appointment on 06/26/2018  Component Date Value Ref Range Status  . Sed Rate 06/26/2018 62* 0 - 20 mm/hr Final   Performed at [facility], [address] [place], [place] [address]  . Sodium 06/26/2018 136  135 - 145 mmol/L Final  . Potassium 06/26/2018 4.4  3.5 - 5.1 mmol/L Final  . Chloride 06/26/2018 99  98 - 111 mmol/L Final  . CO2 06/26/2018 25  22 - 32 mmol/L Final  . Glucose, Bld 06/26/2018 107* 70 - 99 mg/dL Final  . BUN 06/26/2018 22  8 - 23 mg/dL Final  . Creatinine, Ser 06/26/2018 0.98  0.61 - 1.24 mg/dL Final  . Calcium 06/26/2018 9.6  8.9 - 10.3 mg/dL Final  . Total Protein 06/26/2018 7.9  6.5 - 8.1 g/dL Final  . Albumin 06/26/2018 4.2  3.5 - 5.0 g/dL Final  . AST 06/26/2018 27  15 - 41 U/L Final  . ALT 06/26/2018 16  0 - 44 U/L Final  . Alkaline Phosphatase 06/26/2018 48  38 - 126 U/L Final  . Total Bilirubin 06/26/2018 0.4  0.3 - 1.2 mg/dL Final  . GFR calc non Af Amer 06/26/2018 >60  >60 mL/min Final  . GFR calc Af Amer 06/26/2018 >60  >60 mL/min Final   Comment: (NOTE) The eGFR has been calculated using the CKD EPI equation. This calculation has not been validated in all clinical situations. eGFR's persistently <60 mL/min signify possible Chronic Kidney Disease.   Georgiann Hahn gap 06/26/2018 12  5 - 15 Final   Performed at [facility], [address] [place], [place] [address]    Assessment:  Devun Anna is a 77 y.o. male with anemia of chronic disease.  He has had a normocytic anemia since 10/19/2016.  Labs reveal a progressive normocytic anemia since 10/19/2016.  Hematocrit has  decreased from 37.9 to 28.5 (hemoglobin 11.7 to 8.4).  He has been on oral iron and B12 x 1 year.  He has a monoclonal gammopathy of unknown significance (MGUS).  Diet is good.  His last colonoscopy was > 5 years ago.  He denies any history of  gastric ulcer.  He denies any melena or hematochezia.  Labs on 01/26/2017 revealed a normal B12, RF.  Ferritin was elevated (acute phase reactant). TIBC was 244 (low) c/w anemia of chronic disease.  Reticulocyte count was 2.96%.  CRP was 63.6 (high).   Work-up on 02/21/2017 revealed a 0.3 gm/dL IgG monoclonal protein with kappa light chain specificity.  Free light chain ratio was 1.27 (normal).  24 hour UPEP was negative on 02/27/2017.  Hematocrit was 26.0, hemoglobin 8.2, MCV 81.2, platelets 357,000, WBC 6800 with an ANC of 4400.  Retic was 1.1% (inappropriately low).  Normal studies included: creatinine, calcium, [place], folate, ANA, and immunoglobulins.  Ferritin was 372.  Iron studies included a saturation of 7% and a TIBC of 217 (low).  Sed rate was 119.  Bone survey on 02/22/2017 revealed no lytic or blastic bony lesions. There was multilevel degenerative disc disease throughout the spine.  There was mild symmetric hip joint space narrowing bilaterally consistent with osteoarthritis.  Bone marrow aspirate and biopsy on 03/14/2017 revealed a slightly hypercellular marrow with trilineage hematopoiesis.  There was no significant dyspoiesis.  There was slight plasmacytosis (5%).  Plasma cells displayed staining for kappa and lambda light chians with slight kappa excess.  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  Cytogenetics were normal (46, XY).  SPEP has been followed: 0.6 gm/dL on 01/26/2017, 0.3 on 02/21/2017, 0.3 on 06/27/2017, and 0.5 on 12/26/2017.  He has polymyalgia rheumatica.  With a prednisone pulse, joint pain dramatically improved.  ESR and CRP normalized after initiation of prednisone.  Anemia has improved.     He has peripheral vascular disease and is followed by vascular surgery.  Bilateral lower extremity duplex on 06/27/2017 revealed bilateral DVTs.  There was bilateral femoral venous duplication with occlusive thrombosis in a single femoral vein bilaterally.  There was partial occlusive thrombus left popliteal vein.  He is on Eliquis.  Symptomatically, patient feels well overall. He has chronic swelling in his lower extremities. (+) PVD changes (glossy appearance to skin). Patient describes a chronic "burning" sensation in his legs consistent with a degree of claudication. He is followed by vascular and remains on chronic anticoagulation (apixaban). Patient complains of constipation despite stool softeners. WBC 8000 ([place] 4900). Hemoglobin 9.5 and hematocrit 29.7. Platelets 311,000.  Plan: 1. Labs today: CBC with differential, CMP, SPEP, ferritin, sed rate 2. MGUS - stable  Doing well.  Denies any recent recurrent infections.  M-spike stable at 0.5 g/dL.   Reviewed 1% annual risk of transformation into multiple myeloma.  Will continue routine surveillance every 6 months.  3. Lower extremity DVT - chronic  Stable with no acute changes.  Continued claudication pain in his bilateral lower extremities.  Followed by vascular Delana Meyer, MD).  Hypercoagulable workup (+) for lupus anticoagulant. Discuss lifelong need for anticoagulant therapy.   Patient continues on chronic anticoagulation (apixaban) 5 mg twice daily.  Patient denies any increased bruising or bleeding.  Renal function is stable (creatinine 0.98).  4. Anemia of chronic disease - stable  Hemoglobin 9.5, hematocrit 29.7, MCV 85.8.  ESR elevated at 62. Ferritin 333. Suspect false elevation in ferritin related to inflammation.   Patient on oral B12 and iron supplementation daily.   Hemoglobin trending down (previoulsy 10.7 in 12/2017). Denies bruising or bleeding. Denies chest pain and SOB. Will continue to monitor.  5. RTC in 6  months for MD assessment and labs (CBC with diff, CMP, SPEP, ferritin, sed rate).   Honor Loh, NP  06/26/2018, 11:13 AM

## 2018-06-26 NOTE — Progress Notes (Signed)
Patient complains of constipation.  States he is taking 2 softeners daily.  Otherwise, offers no complaints.

## 2018-06-27 LAB — PROTEIN ELECTROPHORESIS, SERUM
A/G Ratio: 1.3 (ref 0.7–1.7)
Albumin ELP: 3.9 g/dL (ref 2.9–4.4)
Alpha-1-Globulin: 0.2 g/dL (ref 0.0–0.4)
Alpha-2-Globulin: 0.8 g/dL (ref 0.4–1.0)
Beta Globulin: 1.1 g/dL (ref 0.7–1.3)
Gamma Globulin: 1 g/dL (ref 0.4–1.8)
Globulin, Total: 3.1 g/dL (ref 2.2–3.9)
M-Spike, %: 0.5 g/dL — ABNORMAL HIGH
Total Protein ELP: 7 g/dL (ref 6.0–8.5)

## 2018-06-27 LAB — FERRITIN: Ferritin: 333 ng/mL (ref 24–336)

## 2018-11-04 ENCOUNTER — Encounter (INDEPENDENT_AMBULATORY_CARE_PROVIDER_SITE_OTHER): Payer: Self-pay | Admitting: Vascular Surgery

## 2018-11-04 ENCOUNTER — Ambulatory Visit (INDEPENDENT_AMBULATORY_CARE_PROVIDER_SITE_OTHER): Payer: Medicare HMO | Admitting: Vascular Surgery

## 2018-11-04 VITALS — BP 129/56 | HR 81 | Resp 14 | Ht 73.0 in | Wt 232.2 lb

## 2018-11-04 DIAGNOSIS — I739 Peripheral vascular disease, unspecified: Secondary | ICD-10-CM | POA: Diagnosis not present

## 2018-11-04 DIAGNOSIS — E1140 Type 2 diabetes mellitus with diabetic neuropathy, unspecified: Secondary | ICD-10-CM

## 2018-11-04 DIAGNOSIS — K219 Gastro-esophageal reflux disease without esophagitis: Secondary | ICD-10-CM | POA: Diagnosis not present

## 2018-11-04 DIAGNOSIS — E782 Mixed hyperlipidemia: Secondary | ICD-10-CM

## 2018-11-04 DIAGNOSIS — Z87891 Personal history of nicotine dependence: Secondary | ICD-10-CM

## 2018-11-04 DIAGNOSIS — I82533 Chronic embolism and thrombosis of popliteal vein, bilateral: Secondary | ICD-10-CM | POA: Diagnosis not present

## 2018-11-04 DIAGNOSIS — I10 Essential (primary) hypertension: Secondary | ICD-10-CM

## 2018-11-04 NOTE — Progress Notes (Signed)
MRN : [id number]  Victor Dixon is a 77 y.o. (07-13-1941) male who presents with chief complaint of  Chief Complaint  Patient presents with  . Follow-up  .  History of Present Illness:   The patient presents to the office for evaluation of DVT.  DVT was identified at [facility] by Duplex ultrasound.  The initial symptoms were pain and swelling in the lower extremity.  The patient notes the leg continues to be very painful with dependency and swells quite a bite.  Symptoms are much better with elevation.  The patient notes minimal edema in the morning which steadily worsens throughout the day.    The patient has not been using compression therapy at this point.  No SOB or pleuritic chest pains.  No cough or hemoptysis.  No blood per rectum or blood in any sputum.  No excessive bruising per the patient.   Current Meds  Medication Sig  . apixaban (ELIQUIS) 5 MG TABS tablet Take 1 tablet (5 mg total) by mouth 2 (two) times daily.  Marland Kitchen aspirin EC 81 MG tablet Take 81 mg by mouth daily.  . carvedilol (COREG) 6.25 MG tablet Take 6.25 mg by mouth 2 (two) times daily with a meal.  . cilostazol (PLETAL) 100 MG tablet Take 100 mg by mouth 2 (two) times daily.  . Cyanocobalamin (VITAMIN B 12) 250 MCG LOZG Take 500 mcg by mouth every morning.  . ferrous sulfate 325 (65 FE) MG tablet Take 325 mg by mouth daily with breakfast.  . finasteride (PROSCAR) 5 MG tablet Take 5 mg by mouth daily.  Marland Kitchen gabapentin (NEURONTIN) 300 MG capsule Take 300 mg by mouth 3 (three) times daily.  Marland Kitchen glimepiride (AMARYL) 2 MG tablet Take 2 mg by mouth daily with breakfast.  . losartan-hydrochlorothiazide (HYZAAR) 100-25 MG tablet Take by mouth.  . metFORMIN (GLUCOPHAGE-XR) 500 MG 24 hr tablet Take 500 mg by mouth 2 (two) times daily with a meal.  . pantoprazole (PROTONIX) 40 MG tablet Take 40 mg by mouth daily.  . predniSONE (DELTASONE) 1 MG tablet Take 3 tablets by mouth daily.  Marland Kitchen senna (SENOKOT) 8.6 MG tablet Take  1 tablet by mouth daily.  . simvastatin (ZOCOR) 40 MG tablet Take 40 mg by mouth daily.  . [DISCONTINUED] ELIQUIS 5 MG TABS tablet TAKE 1 TABLET BY MOUTH TWICE DAILY    Past Medical History:  Diagnosis Date  . Anemia   . Diabetes mellitus without complication ([place])   . GERD (gastroesophageal reflux disease)   . Hypertension     Past Surgical History:  Procedure Laterality Date  . APPENDECTOMY    . CAROTID ARTERY ANGIOPLASTY Right   . PERIPHERAL VASCULAR CATHETERIZATION Left 08/15/2016   Procedure: Lower Extremity Angiography;  Surgeon: Katha Cabal, MD;  Location: [facility];  Service: Cardiovascular;  Laterality: Left;  . PERIPHERAL VASCULAR CATHETERIZATION  08/15/2016   Procedure: Lower Extremity Intervention;  Surgeon: Katha Cabal, MD;  Location: [facility];  Service: Cardiovascular;;    Social History Social History   Tobacco Use  . Smoking status: Former Smoker    Packs/day: 0.50    Years: 30.00    Pack years: 15.00    Last attempt to quit: 02/22/2007    Years since quitting: 11.7  . Smokeless tobacco: Never Used  Substance Use Topics  . Alcohol use: No  . Drug use: No    Family History Family History  Problem Relation Age of Onset  .  Cancer Sister     No Known Allergies   REVIEW OF SYSTEMS (Negative unless checked)  Constitutional: [] Weight loss  [] Fever  [] Chills Cardiac: [] Chest pain   [] Chest pressure   [] Palpitations   [] Shortness of breath when laying flat   [] Shortness of breath with exertion. Vascular:  [] Pain in legs with walking   [] Pain in legs at rest  [x] History of DVT   [] Phlebitis   [x] Swelling in legs   [] Varicose veins   [] Non-healing ulcers Pulmonary:   [] Uses home oxygen   [] Productive cough   [] Hemoptysis   [] Wheeze  [] COPD   [] Asthma Neurologic:  [] Dizziness   [] Seizures   [] History of stroke   [] History of TIA  [] Aphasia   [] Vissual changes   [] Weakness or numbness in arm   [] Weakness or numbness in  leg Musculoskeletal:   [] Joint swelling   [] Joint pain   [] Low back pain Hematologic:  [] Easy bruising  [] Easy bleeding   [] Hypercoagulable state   [] Anemic Gastrointestinal:  [] Diarrhea   [] Vomiting  [] Gastroesophageal reflux/heartburn   [] Difficulty swallowing. Genitourinary:  [] Chronic kidney disease   [] Difficult urination  [] Frequent urination   [] Blood in urine Skin:  [] Rashes   [] Ulcers  Psychological:  [] History of anxiety   []  History of major depression.  Physical Examination  Vitals:   11/04/18 1358  BP: (!) 129/56  Pulse: 81  Resp: 14  Weight: 232 lb 3.2 oz (105.3 kg)  Height: 6\' 1"  (1.854 m)   Body mass index is 30.64 kg/m. Gen: WD/WN, NAD Head: [place]/AT, No temporalis wasting.  Ear/Nose/Throat: Hearing grossly intact, nares w/o erythema or drainage Eyes: PER, EOMI, sclera nonicteric.  Neck: Supple, no large masses.   Pulmonary:  Good air movement, no audible wheezing bilaterally, no use of accessory muscles.  Cardiac: RRR, no JVD Vascular: scattered varicosities present bilaterally.  Mild venous stasis changes to the legs bilaterally.  2+ soft pitting edema Vessel Right Left  Radial Palpable Palpable  Gastrointestinal: Non-distended. No guarding/no peritoneal signs.  Musculoskeletal: M/S 5/5 throughout.  No deformity or atrophy.  Neurologic: CN 2-12 intact. Symmetrical.  Speech is fluent. Motor exam as listed above. Psychiatric: Judgment intact, Mood & affect appropriate for pt's clinical situation. Dermatologic: mild venous rashes no ulcers noted.  No changes consistent with cellulitis. Lymph : No lichenification or skin changes of chronic lymphedema.  CBC Lab Results  Component Value Date   WBC 8.0 06/26/2018   HGB 9.5 (L) 06/26/2018   HCT 29.7 (L) 06/26/2018   MCV 85.8 06/26/2018   PLT 311 06/26/2018    BMET    Component Value Date/Time   NA 136 06/26/2018 1040   NA 139 08/23/2013 0231   K 4.4 06/26/2018 1040   K 3.7 08/23/2013 0231   CL 99  06/26/2018 1040   CL 104 08/23/2013 0231   CO2 25 06/26/2018 1040   CO2 31 08/23/2013 0231   GLUCOSE 107 (H) 06/26/2018 1040   GLUCOSE 133 (H) 08/23/2013 0231   BUN 22 06/26/2018 1040   BUN 9 08/23/2013 0231   CREATININE 0.98 06/26/2018 1040   CREATININE 0.79 08/23/2013 0231   CALCIUM 9.6 06/26/2018 1040   CALCIUM 8.5 08/23/2013 0231   GFRNONAA >60 06/26/2018 1040   GFRNONAA >60 08/23/2013 0231   GFRAA >60 06/26/2018 1040   GFRAA >60 08/23/2013 0231   CrCl cannot be calculated (Patient's most recent lab result is older than the maximum 21 days allowed.).  COAG Lab Results  Component Value Date   INR  0.96 03/14/2017   INR 1.1 08/23/2013    Radiology No results found.   Assessment/Plan 1. Chronic deep vein thrombosis (DVT) of popliteal vein of both lower extremities (HCC) Recommend:   No surgery or intervention at this point in time.  IVC filter is not indicated at present.  The patient is initiated on anticoagulation which he says is followed by his primary MD  Elevation was stressed, use of a recliner was discussed.  I have had a long discussion with the patient regarding DVT and post phlebitic changes such as swelling and why it  causes symptoms such as pain.  The patient will wear graduated compression stockings class 1 (20-30 mmHg), beginning after three full days of anticoagulation, on a daily basis a prescription was given. The patient will  beginning wearing the stockings first thing in the morning and removing them in the evening. The patient is instructed specifically not to sleep in the stockings.  In addition, behavioral modification including elevation during the day and avoidance of prolonged dependency will be initiated.    The patient will continue anticoagulation for now as there have not been any problems or complications at this point.    2. PAD (peripheral artery disease) (HCC) Recommend:  I do not find evidence of life style limiting vascular  disease. The patient specifically denies life style limitation.  Previous noninvasive studies including ABI's of the legs do not identify critical vascular problems.  The patient should continue walking and begin a more formal exercise program. The patient should continue his antiplatelet therapy and aggressive treatment of the lipid abnormalities.  The patient should begin wearing graduated compression socks 15-20 mmHg strength to control her mild edema.  Patient will follow-up with me on a PRN basis   3. Essential hypertension Continue antihypertensive medications as already ordered, these medications have been reviewed and there are no changes at this time.   4. Gastroesophageal reflux disease without esophagitis Continue PPI as already ordered, this medication has been reviewed and there are no changes at this time.  Avoidence of caffeine and alcohol  Moderate elevation of the head of the bed   5. Type 2 diabetes mellitus with diabetic neuropathy, without long-term current use of insulin (HCC) Continue hypoglycemic medications as already ordered, these medications have been reviewed and there are no changes at this time.  Hgb A1C to be monitored as already arranged by primary service   6. Mixed hyperlipidemia Continue statin as ordered and reviewed, no changes at this time    Hortencia Pilar, MD  11/04/2018 2:15 PM

## 2018-11-07 ENCOUNTER — Encounter (INDEPENDENT_AMBULATORY_CARE_PROVIDER_SITE_OTHER): Payer: Self-pay | Admitting: Vascular Surgery

## 2018-12-25 ENCOUNTER — Inpatient Hospital Stay: Payer: Medicare HMO | Attending: Hematology and Oncology | Admitting: Hematology and Oncology

## 2018-12-25 ENCOUNTER — Other Ambulatory Visit: Payer: Self-pay | Admitting: Hematology and Oncology

## 2018-12-25 ENCOUNTER — Encounter: Payer: Self-pay | Admitting: Hematology and Oncology

## 2018-12-25 ENCOUNTER — Inpatient Hospital Stay: Payer: Medicare HMO

## 2018-12-25 VITALS — BP 94/49 | HR 76 | Temp 97.7°F | Resp 18 | Ht 73.0 in | Wt 231.4 lb

## 2018-12-25 DIAGNOSIS — E119 Type 2 diabetes mellitus without complications: Secondary | ICD-10-CM

## 2018-12-25 DIAGNOSIS — I82413 Acute embolism and thrombosis of femoral vein, bilateral: Secondary | ICD-10-CM | POA: Diagnosis not present

## 2018-12-25 DIAGNOSIS — D6489 Other specified anemias: Secondary | ICD-10-CM

## 2018-12-25 DIAGNOSIS — I82432 Acute embolism and thrombosis of left popliteal vein: Secondary | ICD-10-CM | POA: Diagnosis not present

## 2018-12-25 DIAGNOSIS — D472 Monoclonal gammopathy: Secondary | ICD-10-CM | POA: Insufficient documentation

## 2018-12-25 DIAGNOSIS — I739 Peripheral vascular disease, unspecified: Secondary | ICD-10-CM

## 2018-12-25 DIAGNOSIS — D638 Anemia in other chronic diseases classified elsewhere: Secondary | ICD-10-CM | POA: Insufficient documentation

## 2018-12-25 DIAGNOSIS — Z7901 Long term (current) use of anticoagulants: Secondary | ICD-10-CM | POA: Insufficient documentation

## 2018-12-25 DIAGNOSIS — Z87891 Personal history of nicotine dependence: Secondary | ICD-10-CM | POA: Diagnosis not present

## 2018-12-25 DIAGNOSIS — I82433 Acute embolism and thrombosis of popliteal vein, bilateral: Secondary | ICD-10-CM

## 2018-12-25 DIAGNOSIS — Z79899 Other long term (current) drug therapy: Secondary | ICD-10-CM | POA: Diagnosis not present

## 2018-12-25 DIAGNOSIS — I82533 Chronic embolism and thrombosis of popliteal vein, bilateral: Secondary | ICD-10-CM

## 2018-12-25 LAB — COMPREHENSIVE METABOLIC PANEL
ALBUMIN: 4.3 g/dL (ref 3.5–5.0)
ALT: 15 U/L (ref 0–44)
AST: 21 U/L (ref 15–41)
Alkaline Phosphatase: 56 U/L (ref 38–126)
Anion gap: 8 (ref 5–15)
BUN: 14 mg/dL (ref 8–23)
CO2: 30 mmol/L (ref 22–32)
Calcium: 9.8 mg/dL (ref 8.9–10.3)
Chloride: 98 mmol/L (ref 98–111)
Creatinine, Ser: 1 mg/dL (ref 0.61–1.24)
GFR calc Af Amer: >60 mL/min (ref >60)
GFR calc non Af Amer: >60 mL/min (ref >60)
Glucose, Bld: 108 mg/dL — ABNORMAL HIGH (ref 70–99)
POTASSIUM: 3.9 mmol/L (ref 3.5–5.1)
Sodium: 136 mmol/L (ref 135–145)
Total Bilirubin: 0.9 mg/dL (ref 0.3–1.2)
Total Protein: 7.8 g/dL (ref 6.5–8.1)

## 2018-12-25 LAB — CBC WITH DIFFERENTIAL/PLATELET
ABS IMMATURE GRANULOCYTES: 0.01 10*3/uL (ref 0.00–0.07)
Basophils Absolute: 0.1 10*3/uL (ref 0.0–0.1)
Basophils Relative: 1 %
Eosinophils Absolute: 0.2 10*3/uL (ref 0.0–0.5)
Eosinophils Relative: 2 %
HCT: 34.2 % — ABNORMAL LOW (ref 39.0–52.0)
Hemoglobin: 10.3 g/dL — ABNORMAL LOW (ref 13.0–17.0)
Immature Granulocytes: 0 %
Lymphocytes Relative: 32 %
Lymphs Abs: 2.3 10*3/uL (ref 0.7–4.0)
MCH: 27.2 pg (ref 26.0–34.0)
MCHC: 30.1 g/dL (ref 30.0–36.0)
MCV: 90.5 fL (ref 80.0–100.0)
Monocytes Absolute: 0.7 10*3/uL (ref 0.1–1.0)
Monocytes Relative: 10 %
NEUTROS ABS: 4 10*3/uL (ref 1.7–7.7)
Neutrophils Relative %: 55 %
PLATELETS: 224 10*3/uL (ref 150–400)
RBC: 3.78 MIL/uL — ABNORMAL LOW (ref 4.22–5.81)
RDW: 15 % (ref 11.5–15.5)
WBC: 7.1 10*3/uL (ref 4.0–10.5)
nRBC: 0 % (ref 0.0–0.2)

## 2018-12-25 LAB — RETICULOCYTES
Immature Retic Fract: 24.1 % — ABNORMAL HIGH (ref 2.3–15.9)
RBC.: 3.84 MIL/uL — ABNORMAL LOW (ref 4.22–5.81)
Retic Count, Absolute: 74.9 10*3/uL (ref 19.0–186.0)
Retic Ct Pct: 2 % (ref 0.4–3.1)

## 2018-12-25 LAB — VITAMIN B12: Vitamin B-12: 499 pg/mL (ref 180–914)

## 2018-12-25 LAB — TSH: TSH: 1.229 u[IU]/mL (ref 0.350–4.500)

## 2018-12-25 LAB — SEDIMENTATION RATE: Sed Rate: 57 mm/hr — ABNORMAL HIGH (ref 0–20)

## 2018-12-25 LAB — FERRITIN: Ferritin: 182 ng/mL (ref 24–336)

## 2018-12-25 LAB — FOLATE: Folate: 17.1 ng/mL (ref >5.9)

## 2018-12-25 NOTE — Progress Notes (Signed)
No new changes noted today 

## 2018-12-25 NOTE — Progress Notes (Signed)
[facility] day:  12/25/2018  Chief Complaint: Victor Dixon is a 78 y.o. male with anemia of chronic disease, monoclonal gammopathy of unknown significance (MGUS), and bilateral lower extremity DVTs who is seen for 6 month assessment.  HPI:  The patient was last seen in the medical oncology clinic on 06/26/2018 by Honor Loh, NP.  At that time, he felt well overall. He had chronic swelling in his lower extremities. (+) PVD changes (glossy appearance to skin). Patient described a chronic "burning" sensation in his legs c/w a degree of claudication.  He remained on chronic anticoagulation (apixaban).  Patient complained of constipation despite stool softeners. Hemoglobin was 9.5 and hematocrit 29.7. Platelets were 311,000.  WBC was 8000 ([place] 4900).  Ferritin was 333.  Sed rate was 62.  Calcium was 9.6.  Albumin was 4.2.  Creatinine was 0.98.  During the interim, he has done well overall.  Lower extremity "burning" and swelling persists. He is followed by vascular for claudication pain. He is on chronic anticoagulation therapy (apixaban). Patient denies bleeding; no hematochezia, melena, or gross hematuria. Patient denies that he has experienced any B symptoms. He denies any interval infections.   Patient advises that he maintains an adequate appetite. He is eating well. Weight today is 231 lb 6 oz (104.9 kg), which compared to his last visit to the clinic, represents a 2 pound decrease.  Patient denies pain in the clinic today.   Past Medical History:  Diagnosis Date  . Anemia   . Diabetes mellitus without complication ([place])   . GERD (gastroesophageal reflux disease)   . Hypertension     Past Surgical History:  Procedure Laterality Date  . APPENDECTOMY    . CAROTID ARTERY ANGIOPLASTY Right   . PERIPHERAL VASCULAR CATHETERIZATION Left 08/15/2016   Procedure: Lower Extremity Angiography;  Surgeon: Katha Cabal, MD;  Location: [facility];  Service: Cardiovascular;  Laterality: Left;  . PERIPHERAL VASCULAR CATHETERIZATION  08/15/2016   Procedure: Lower Extremity Intervention;  Surgeon: Katha Cabal, MD;  Location: [facility];  Service: Cardiovascular;;    Family History  Problem Relation Age of Onset  . Cancer Sister     Social History:  reports that he quit smoking about 11 years ago. He has a 15.00 pack-year smoking history. He has never used smokeless tobacco. He reports that he does not drink alcohol or use drugs.  He smoked a 1/2 pack a day since age 89.  He stopped smoking 10 years ago.  The patient's wife died in 02/08/2017.  The patient is alone today.  Allergies: No Known Allergies  Current Medications: Current Outpatient Medications  Medication Sig Dispense Refill  . apixaban (ELIQUIS) 5 MG TABS tablet Take 1 tablet (5 mg total) by mouth 2 (two) times daily. 60 tablet 3  . aspirin EC 81 MG tablet Take 81 mg by mouth daily.    . Calcium Carbonate-Vitamin D (CALCIUM HIGH POTENCY/VITAMIN D) 600-200 MG-UNIT TABS Take by mouth.    . carvedilol (COREG) 6.25 MG tablet Take 6.25 mg by mouth 2 (two) times daily with a meal.    . cilostazol (PLETAL) 100 MG tablet Take 100 mg by mouth 2 (two) times daily.    . Cyanocobalamin (VITAMIN B 12) 250 MCG LOZG Take 500 mcg by mouth every morning.    . ferrous sulfate 325 (65 FE) MG tablet Take 325 mg by mouth daily with breakfast.    . finasteride (  PROSCAR) 5 MG tablet Take 5 mg by mouth daily.    Marland Kitchen gabapentin (NEURONTIN) 300 MG capsule Take 300 mg by mouth 3 (three) times daily.    Marland Kitchen glimepiride (AMARYL) 2 MG tablet Take 2 mg by mouth daily with breakfast.    . losartan-hydrochlorothiazide (HYZAAR) 100-25 MG tablet Take by mouth.    . metFORMIN (GLUCOPHAGE-XR) 500 MG 24 hr tablet Take 500 mg by mouth 2 (two) times daily with a meal.    . pantoprazole (PROTONIX) 40 MG tablet Take 40 mg by mouth daily.    . predniSONE (DELTASONE) 1 MG tablet Take 3 tablets by mouth  daily.    Marland Kitchen senna (SENOKOT) 8.6 MG tablet Take 1 tablet by mouth daily.    . simvastatin (ZOCOR) 40 MG tablet Take 40 mg by mouth daily.     No current facility-administered medications for this visit.    Facility-Administered Medications Ordered in Other Visits  Medication Dose Route Frequency Provider Last Rate Last Dose  . heparin lock flush 100 unit/mL  500 Units Intravenous Once Monia Sabal, PA-C        Review of Systems  Constitutional: Positive for weight loss (2 pounds). Negative for chills, diaphoresis, fever and malaise/fatigue.       Feels "good".  HENT: Negative.  Negative for congestion, ear pain, nosebleeds, sinus pain and sore throat.   Eyes: Negative.  Negative for blurred vision, double vision and photophobia.  Respiratory: Negative.  Negative for cough, hemoptysis, sputum production and shortness of breath.   Cardiovascular: Positive for claudication ("burning" with ambulation) and leg swelling. Negative for chest pain, palpitations, orthopnea and PND.       Peripheral vascular disease.  Gastrointestinal: Positive for constipation. Negative for abdominal pain, blood in stool, diarrhea, melena, nausea and vomiting.       Due for colonoscopy.  Genitourinary: Negative.  Negative for dysuria, frequency, hematuria and urgency.  Musculoskeletal: Positive for back pain (chronic). Negative for falls and joint pain.  Skin: Negative.  Negative for itching and rash.  Neurological: Negative.  Negative for dizziness, tremors, sensory change, speech change, focal weakness, weakness and headaches.  Endo/Heme/Allergies: Negative.  Does not bruise/bleed easily.       Diabetes.  Blood sugar "good".  Psychiatric/Behavioral: Negative.  Negative for depression, hallucinations and memory loss. The patient is not nervous/anxious and does not have insomnia.   All other systems reviewed and are negative.  Performance status (ECOG):  1  Vital Signs BP (!) 94/49 (Patient Position: Sitting)    Pulse 76   Temp 97.7 F (36.5 C) (Tympanic)   Resp 18   Ht '6\' 1"'  (1.854 m)   Wt 231 lb 6 oz (104.9 kg)   SpO2 100%   BMI 30.53 kg/m   Physical Exam  Constitutional: He is oriented to person, place, and time and well-developed, well-nourished, and in no distress. No distress.  HENT:  Head: Normocephalic and atraumatic.  Mouth/Throat: Oropharynx is clear and moist. No oropharyngeal exudate.  Gray hair.  Dentures.  Eyes: Pupils are equal, round, and reactive to light. Conjunctivae and EOM are normal. No scleral icterus.  Glasses.  Brown eyes.  Bilateral arcus.  Neck: Normal range of motion. Neck supple. No JVD present.  Cardiovascular: Normal rate, regular rhythm and normal heart sounds. Exam reveals no gallop and no friction rub.  No murmur heard. Pulmonary/Chest: Effort normal and breath sounds normal. No respiratory distress. He has no wheezes. He has no rales.  Abdominal: Soft. Bowel sounds are  normal. He exhibits no distension and no mass. There is no abdominal tenderness. There is no rebound and no guarding.  Musculoskeletal: Normal range of motion.        General: Edema present. No tenderness.  Lymphadenopathy:    He has no cervical adenopathy.    He has no axillary adenopathy.       Right: No inguinal and no supraclavicular adenopathy present.       Left: No inguinal and no supraclavicular adenopathy present.  Neurological: He is alert and oriented to person, place, and time.  Skin: Skin is warm and dry. No rash noted. He is not diaphoretic. No erythema. No pallor.  Bilateral lower extremity peripheral vascular changes.   Psychiatric: Mood, affect and judgment normal.  Nursing note reviewed.   Appointment on 12/25/2018  Component Date Value Ref Range Status  . Sodium 12/25/2018 136  135 - 145 mmol/L Final  . Potassium 12/25/2018 3.9  3.5 - 5.1 mmol/L Final  . Chloride 12/25/2018 98  98 - 111 mmol/L Final  . CO2 12/25/2018 30  22 - 32 mmol/L Final  . Glucose, Bld  12/25/2018 108* 70 - 99 mg/dL Final  . BUN 12/25/2018 14  8 - 23 mg/dL Final  . Creatinine, Ser 12/25/2018 1.00  0.61 - 1.24 mg/dL Final  . Calcium 12/25/2018 9.8  8.9 - 10.3 mg/dL Final  . Total Protein 12/25/2018 7.8  6.5 - 8.1 g/dL Final  . Albumin 12/25/2018 4.3  3.5 - 5.0 g/dL Final  . AST 12/25/2018 21  15 - 41 U/L Final  . ALT 12/25/2018 15  0 - 44 U/L Final  . Alkaline Phosphatase 12/25/2018 56  38 - 126 U/L Final  . Total Bilirubin 12/25/2018 0.9  0.3 - 1.2 mg/dL Final  . GFR calc non Af Amer 12/25/2018 >60  >60 mL/min Final  . GFR calc Af Amer 12/25/2018 >60  >60 mL/min Final  . Anion gap 12/25/2018 8  5 - 15 Final   Performed at [facility], [address] [place], [place] [address]  . WBC 12/25/2018 7.1  4.0 - 10.5 K/uL Final  . RBC 12/25/2018 3.78* 4.22 - 5.81 MIL/uL Final  . Hemoglobin 12/25/2018 10.3* 13.0 - 17.0 g/dL Final  . HCT 12/25/2018 34.2* 39.0 - 52.0 % Final  . MCV 12/25/2018 90.5  80.0 - 100.0 fL Final  . MCH 12/25/2018 27.2  26.0 - 34.0 pg Final  . MCHC 12/25/2018 30.1  30.0 - 36.0 g/dL Final  . RDW 12/25/2018 15.0  11.5 - 15.5 % Final  . Platelets 12/25/2018 224  150 - 400 K/uL Final  . nRBC 12/25/2018 0.0  0.0 - 0.2 % Final  . Neutrophils Relative % 12/25/2018 55  % Final  . Neutro Abs 12/25/2018 4.0  1.7 - 7.7 K/uL Final  . Lymphocytes Relative 12/25/2018 32  % Final  . Lymphs Abs 12/25/2018 2.3  0.7 - 4.0 K/uL Final  . Monocytes Relative 12/25/2018 10  % Final  . Monocytes Absolute 12/25/2018 0.7  0.1 - 1.0 K/uL Final  . Eosinophils Relative 12/25/2018 2  % Final  . Eosinophils Absolute 12/25/2018 0.2  0.0 - 0.5 K/uL Final  . Basophils Relative 12/25/2018 1  % Final  . Basophils Absolute 12/25/2018 0.1  0.0 - 0.1 K/uL Final  . Immature Granulocytes 12/25/2018 0  % Final  . Abs Immature Granulocytes 12/25/2018 0.01  0.00 - 0.07 K/uL Final   Performed at [facility], [id number]  [address] [place], [place] [address]     Assessment:  Effrey Davidow is a 78 y.o. male with anemia of chronic disease.  He has had a normocytic anemia since 10/19/2016.  Labs reveal a progressive normocytic anemia since 10/19/2016.  Hematocrit has decreased from 37.9 to 28.5 (hemoglobin 11.7 to 8.4).  He has been on oral iron and B12 x 1 year.  He has a monoclonal gammopathy of unknown significance (MGUS).  Diet is good.  His last colonoscopy was > 5 years ago.  He denies any history of gastric ulcer.  He denies any melena or hematochezia.  Labs on 01/26/2017 revealed a normal B12, RF.  Ferritin was elevated (acute phase reactant). TIBC was 244 (low) c/w anemia of chronic disease.  Reticulocyte count was 2.96%.  CRP was 63.6 (high).   Work-up on 02/21/2017 revealed a 0.3 gm/dL IgG monoclonal protein with kappa light chain specificity.  Free light chain ratio was 1.27 (normal).  24 hour UPEP was negative on 02/27/2017.  Hematocrit was 26.0, hemoglobin 8.2, MCV 81.2, platelets 357,000, WBC 6800 with an ANC of 4400.  Retic was 1.1% (inappropriately low).  Normal studies included: creatinine, calcium, [place], folate, ANA, and immunoglobulins.  Ferritin was 372.  Iron studies included a saturation of 7% and a TIBC of 217 (low).  Sed rate was 119.  Bone survey on 02/22/2017 revealed no lytic or blastic bony lesions. There was multilevel degenerative disc disease throughout the spine.  There was mild symmetric hip joint space narrowing bilaterally consistent with osteoarthritis.  Bone marrow aspirate and biopsy on 03/14/2017 revealed a slightly hypercellular marrow with trilineage hematopoiesis.  There was no significant dyspoiesis.  There was slight plasmacytosis (5%).  Plasma cells displayed staining for kappa and lambda light chians with slight kappa excess.  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  Cytogenetics were normal (46, XY).  SPEP has been followed: 0.6 gm/dL on 01/26/2017, 0.3 on  02/21/2017, 0.3 on 06/27/2017, 0.5 on 12/26/2017, 0.5 on 06/26/2018, and 0.3 on 12/25/2018.  He has polymyalgia rheumatica.  With a prednisone pulse, joint pain dramatically improved.  ESR and CRP normalized after initiation of prednisone.  Anemia has improved.    He has peripheral vascular disease and is followed by vascular surgery.  Bilateral lower extremity duplex on 06/27/2017 revealed bilateral DVTs.  There was bilateral femoral venous duplication with occlusive thrombosis in a single femoral vein bilaterally.  There was partial occlusive thrombus left popliteal vein.  He is on Eliquis.  Symptomatically, he is doing well.  He continues to have symptoms from peripheral vascular disease.  Plan: 1. Labs today: CBC with diff, CMP, SPEP, ferritin, sed rate, B12, folate, TSH, retic. 2. Monoclonal gammopathy of unknown significance (MGUS) M-spike is 0.3 gm/dL.  No concerning symptoms. Risk of transformation to a lymphoproliferative disorder is 1%/year. Continue surveillance every 6 months. 3. Lower extremity DVT  Patient with ongoing claudication symptoms.    Patient followed by Dr. Delana Meyer (vascular surgery). Discuss ongoing plan for chronic anticoagulation (Eliquis). 4. Anemia of chronic disease Hematocrit 34.2.  Hemoglobin 10.3.  MCV 90.5. Ferritin 182. B12 499.  Folate 17.1.  Sed rate 57. Continue monitoring. 5.   RTC in 3 months for labs (CBC with diff). 6.   RTC in 6 months for MD assessment and labs (CBC with diff, SMP, SPEP, ferritin, sed rate).   Honor Loh, NP  12/25/2018, 11:33 AM   I saw and evaluated the patient, participating in the key portions of the  service and reviewing pertinent diagnostic studies and records.  I reviewed the nurse practitioner's note and agree with the findings and the plan.  The assessment and plan were discussed with the patient.  Several questions were asked by the patient and answered.   Nolon Stalls, MD 12/25/2018,11:33 AM

## 2018-12-26 LAB — PROTEIN ELECTROPHORESIS, SERUM
A/G Ratio: 1.3 (ref 0.7–1.7)
Albumin ELP: 3.9 g/dL (ref 2.9–4.4)
Alpha-1-Globulin: 0.2 g/dL (ref 0.0–0.4)
Alpha-2-Globulin: 0.7 g/dL (ref 0.4–1.0)
Beta Globulin: 1.1 g/dL (ref 0.7–1.3)
Gamma Globulin: 1.1 g/dL (ref 0.4–1.8)
Globulin, Total: 3.1 g/dL (ref 2.2–3.9)
M-SPIKE, %: 0.3 g/dL — AB
Total Protein ELP: 7 g/dL (ref 6.0–8.5)

## 2019-02-13 ENCOUNTER — Other Ambulatory Visit (INDEPENDENT_AMBULATORY_CARE_PROVIDER_SITE_OTHER): Payer: Self-pay | Admitting: Vascular Surgery

## 2019-02-13 NOTE — Telephone Encounter (Signed)
This is a PRN follow up patient. Does this medication need to be refilled by PCP?

## 2019-02-19 IMAGING — CT CT BIOPSY AND ASPIRATION BONE MARROW
1 of 5 series · 2 of 14 positions shown, 3 images · non-contrast
Comparison: none

CLINICAL DATA: Monoclonal gammopathy and anemia. Bone marrow biopsy
required for further workup.

[Series 2: i-spiral 5.0 b30f · axial · 0.84mm/px · z∈[-88,-64]mm · 2 of 21 slices shown, 3 images]
[im 7/21  soft-tissue]
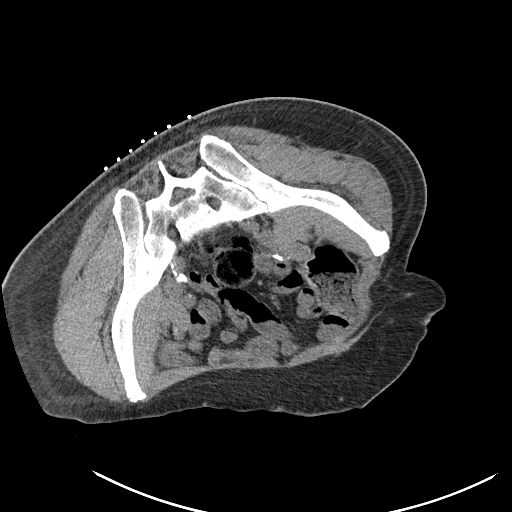
[im 7/21  bone]
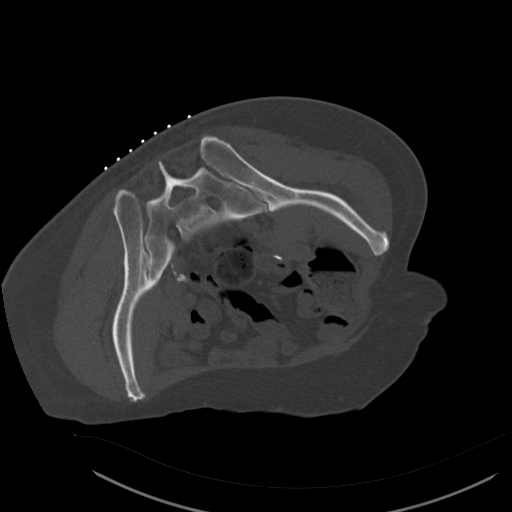
[im 14/21  bone]
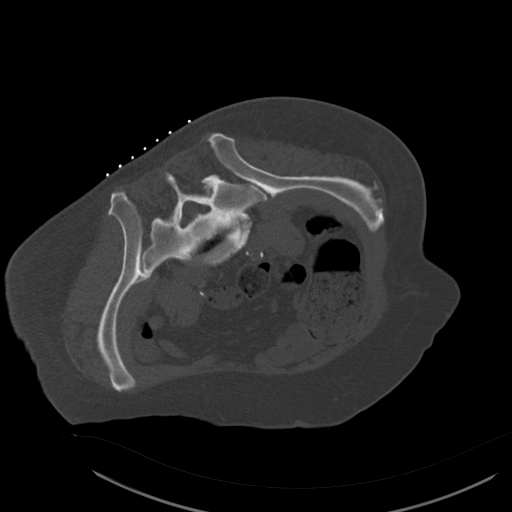

[2 of 14 positions shown; findings below may reference images not displayed]

EXAM:
CT GUIDED BONE MARROW ASPIRATION AND BIOPSY

ANESTHESIA/SEDATION:
Versed 2.0 mg IV, Fentanyl 100 mcg IV

Total Moderate Sedation Time:  18 minutes.

The patient's level of consciousness and physiologic status were
continuously monitored during the procedure by Radiology nursing.

PROCEDURE:
The procedure risks, benefits, and alternatives were explained to
the patient. Questions regarding the procedure were encouraged and
answered. The patient understands and consents to the procedure. A
time out was performed prior to initiating the procedure.

The right gluteal region was prepped with chlorhexidine. Sterile
gown and sterile gloves were used for the procedure. Local
anesthesia was provided with 1% Lidocaine.

Under CT guidance, an 11 gauge On Control bone cutting needle was
advanced from a posterior approach into the right iliac bone. Needle
positioning was confirmed with CT. Initial non heparinized and
heparinized aspirate samples were obtained of bone marrow. Core
biopsy was performed via the On Control drill needle.

COMPLICATIONS:
None
FINDINGS: Inspection of initial aspirate did reveal visible particles. Intact
core biopsy sample was obtained.
IMPRESSION: CT guided bone marrow biopsy of right posterior iliac bone with both
aspirate and core samples obtained.

## 2019-03-03 NOTE — Telephone Encounter (Signed)
Please see prior note and advise on refill.

## 2019-03-03 NOTE — Telephone Encounter (Signed)
We can to a one month with one refill.  After the patient should talk with his PCP about managing his eliquis or we will need to have him in for a follow up visit.

## 2019-03-24 ENCOUNTER — Other Ambulatory Visit: Payer: Self-pay

## 2019-03-25 ENCOUNTER — Inpatient Hospital Stay: Payer: Medicare HMO | Attending: Hematology and Oncology

## 2019-03-25 DIAGNOSIS — D472 Monoclonal gammopathy: Secondary | ICD-10-CM | POA: Insufficient documentation

## 2019-03-25 LAB — CBC WITH DIFFERENTIAL/PLATELET
Abs Immature Granulocytes: 0.02 10*3/uL (ref 0.00–0.07)
Basophils Absolute: 0 10*3/uL (ref 0.0–0.1)
Basophils Relative: 1 %
Eosinophils Absolute: 0.2 10*3/uL (ref 0.0–0.5)
Eosinophils Relative: 3 %
HCT: 35 % — ABNORMAL LOW (ref 39.0–52.0)
Hemoglobin: 10.5 g/dL — ABNORMAL LOW (ref 13.0–17.0)
Immature Granulocytes: 0 %
Lymphocytes Relative: 35 %
Lymphs Abs: 2.6 10*3/uL (ref 0.7–4.0)
MCH: 26.9 pg (ref 26.0–34.0)
MCHC: 30 g/dL (ref 30.0–36.0)
MCV: 89.7 fL (ref 80.0–100.0)
Monocytes Absolute: 0.6 10*3/uL (ref 0.1–1.0)
Monocytes Relative: 9 %
Neutro Abs: 3.9 10*3/uL (ref 1.7–7.7)
Neutrophils Relative %: 52 %
Platelets: 208 10*3/uL (ref 150–400)
RBC: 3.9 MIL/uL — ABNORMAL LOW (ref 4.22–5.81)
RDW: 15.2 % (ref 11.5–15.5)
WBC: 7.4 10*3/uL (ref 4.0–10.5)
nRBC: 0 % (ref 0.0–0.2)

## 2019-06-11 ENCOUNTER — Encounter: Payer: Self-pay | Admitting: Emergency Medicine

## 2019-06-11 ENCOUNTER — Emergency Department
Admission: EM | Admit: 2019-06-11 | Discharge: 2019-06-11 | Disposition: A | Discharge status: Home / Self Care | Payer: Medicare HMO | Attending: Emergency Medicine | Admitting: Emergency Medicine

## 2019-06-11 ENCOUNTER — Other Ambulatory Visit: Payer: Self-pay

## 2019-06-11 DIAGNOSIS — Z7901 Long term (current) use of anticoagulants: Secondary | ICD-10-CM | POA: Insufficient documentation

## 2019-06-11 DIAGNOSIS — Z87891 Personal history of nicotine dependence: Secondary | ICD-10-CM | POA: Diagnosis not present

## 2019-06-11 DIAGNOSIS — E1122 Type 2 diabetes mellitus with diabetic chronic kidney disease: Secondary | ICD-10-CM | POA: Insufficient documentation

## 2019-06-11 DIAGNOSIS — Z79899 Other long term (current) drug therapy: Secondary | ICD-10-CM | POA: Insufficient documentation

## 2019-06-11 DIAGNOSIS — Z7984 Long term (current) use of oral hypoglycemic drugs: Secondary | ICD-10-CM | POA: Insufficient documentation

## 2019-06-11 DIAGNOSIS — L0291 Cutaneous abscess, unspecified: Secondary | ICD-10-CM | POA: Diagnosis present

## 2019-06-11 DIAGNOSIS — L0231 Cutaneous abscess of buttock: Secondary | ICD-10-CM | POA: Diagnosis not present

## 2019-06-11 DIAGNOSIS — Z7982 Long term (current) use of aspirin: Secondary | ICD-10-CM | POA: Insufficient documentation

## 2019-06-11 DIAGNOSIS — I129 Hypertensive chronic kidney disease with stage 1 through stage 4 chronic kidney disease, or unspecified chronic kidney disease: Secondary | ICD-10-CM | POA: Insufficient documentation

## 2019-06-11 DIAGNOSIS — N189 Chronic kidney disease, unspecified: Secondary | ICD-10-CM | POA: Diagnosis not present

## 2019-06-11 MED ORDER — TRAMADOL HCL 50 MG PO TABS: 50.0000 mg | ORAL_TABLET | Freq: Two times a day (BID) | ORAL | 0 refills | Status: DC | PRN

## 2019-06-11 MED ORDER — LIDOCAINE-EPINEPHRINE (PF) 2 %-1:200000 IJ SOLN
10.0000 mL | Freq: Once | INTRAMUSCULAR | Status: AC
  Administered 2019-06-11: 10 mL
  Filled 2019-06-11: qty 10

## 2019-06-11 MED ORDER — SULFAMETHOXAZOLE-TRIMETHOPRIM 800-160 MG PO TABS: 1.0000 | ORAL_TABLET | Freq: Two times a day (BID) | ORAL | 0 refills | Status: DC

## 2019-06-11 NOTE — ED Triage Notes (Signed)
Pt presents to ED via POV with c/o L hip ascess x 4-5 days. Pt states hx of same approx 2 yrs ago.

## 2019-06-11 NOTE — ED Provider Notes (Signed)
[facility] Emergency Department Provider Note   ____________________________________________   First MD Initiated Contact with Patient 06/11/19 1138     (approximate)  I have reviewed the triage vital signs and the nursing notes.   HISTORY  Chief Complaint Abscess    HPI Victor Dixon is a 78 y.o. male patient presents with an abscess to the left buttocks for 1 week.  Patient denies drainage.  Patient has similar episode last year.  Patient rates pain as a 9/10.  Patient described pain as "achy".  Patient denies fever or drainage.         Past Medical History:  Diagnosis Date  . Anemia   . Diabetes mellitus without complication ([place])   . GERD (gastroesophageal reflux disease)   . Hypertension     Patient Active Problem List   Diagnosis Date Noted  . PAD (peripheral artery disease) ([place]) 11/04/2018  . Chronic deep vein thrombosis (DVT) ([place]) 07/02/2017  . Bilateral lower extremity edema 06/27/2017  . Anemia 02/21/2017  . Monoclonal gammopathy of unknown significance (MGUS) 02/21/2017  . Pneumonia 12/31/2016  . Atherosclerosis of native arteries of extremity with intermittent claudication ([place]) 12/28/2016  . Essential hypertension 12/28/2016  . Hyperlipidemia 12/28/2016  . Type 2 diabetes mellitus ([place]) 12/28/2016  . GERD (gastroesophageal reflux disease) 12/28/2016  . Chronic kidney disease 10/17/2016    Past Surgical History:  Procedure Laterality Date  . APPENDECTOMY    . CAROTID ARTERY ANGIOPLASTY Right   . PERIPHERAL VASCULAR CATHETERIZATION Left 08/15/2016   Procedure: Lower Extremity Angiography;  Surgeon: Katha Cabal, MD;  Location: [place] [facility];  Service: Cardiovascular;  Laterality: Left;  . PERIPHERAL VASCULAR CATHETERIZATION  08/15/2016   Procedure: Lower Extremity Intervention;  Surgeon: Katha Cabal, MD;  Location: [place] [facility];  Service: Cardiovascular;;    Prior to Admission medications    Medication Sig Start Date End Date Taking? Authorizing Provider  aspirin EC 81 MG tablet Take 81 mg by mouth daily.    [provider]  Calcium Carbonate-Vitamin D (CALCIUM HIGH POTENCY/VITAMIN D) 600-200 MG-UNIT TABS Take by mouth. 05/31/17 12/25/18  [provider]  carvedilol (COREG) 6.25 MG tablet Take 6.25 mg by mouth 2 (two) times daily with a meal.    [provider]  cilostazol (PLETAL) 100 MG tablet Take 100 mg by mouth 2 (two) times daily.    [provider]  Cyanocobalamin (VITAMIN B 12) 250 MCG LOZG Take 500 mcg by mouth every morning.    [provider]  ELIQUIS 5 MG TABS tablet Take 1 tablet by mouth twice daily 03/03/19   Kris Hartmann, NP  ferrous sulfate 325 (65 FE) MG tablet Take 325 mg by mouth daily with breakfast.    [provider]  finasteride (PROSCAR) 5 MG tablet Take 5 mg by mouth daily.    [provider]  gabapentin (NEURONTIN) 300 MG capsule Take 300 mg by mouth 3 (three) times daily.    [provider]  glimepiride (AMARYL) 2 MG tablet Take 2 mg by mouth daily with breakfast.    [provider]  losartan-hydrochlorothiazide (HYZAAR) 100-25 MG tablet Take by mouth. 10/26/17   [provider]  metFORMIN (GLUCOPHAGE-XR) 500 MG 24 hr tablet Take 500 mg by mouth 2 (two) times daily with a meal.    [provider]  pantoprazole (PROTONIX) 40 MG tablet Take 40 mg by mouth daily.    [provider]  predniSONE (DELTASONE) 1  MG tablet Take 3 tablets by mouth daily. 05/02/18   [provider]  senna (SENOKOT) 8.6 MG tablet Take 1 tablet by mouth daily.    [provider]  simvastatin (ZOCOR) 40 MG tablet Take 40 mg by mouth daily.    [provider]  sulfamethoxazole-trimethoprim (BACTRIM DS) 800-160 MG tablet Take 1 tablet by mouth 2 (two) times daily. 06/11/19   Sable Feil, PA-C  traMADol (ULTRAM) 50 MG tablet Take 1 tablet (50 mg total) by  mouth every 12 (twelve) hours as needed. 06/11/19   Sable Feil, PA-C    Allergies Patient has no known allergies.  Family History  Problem Relation Age of Onset  . Cancer Sister     Social History Social History   Tobacco Use  . Smoking status: Former Smoker    Packs/day: 0.50    Years: 30.00    Pack years: 15.00    Quit date: 02/22/2007    Years since quitting: 12.3  . Smokeless tobacco: Never Used  Substance Use Topics  . Alcohol use: No  . Drug use: No    Review of Systems  Constitutional: No fever/chills Eyes: No visual changes. ENT: No sore throat. Cardiovascular: Denies chest pain. Respiratory: Denies shortness of breath. Gastrointestinal: No abdominal pain.  No nausea, no vomiting.  No diarrhea.  No constipation. Genitourinary: Negative for dysuria. Musculoskeletal: Negative for back pain. Skin: Negative for rash.  Nodule lesion left buttock. Neurological: Negative for headaches, focal weakness or numbness. Endocrine:  Diabetes and hypertension Hematological/Lymphatic:  Anemia Allergic/Immunilogical: **}  ____________________________________________   PHYSICAL EXAM:  VITAL SIGNS: ED Triage Vitals  Enc Vitals Group     BP 06/11/19 1118 (!) 155/54     Pulse Rate 06/11/19 1118 (!) 102     Resp 06/11/19 1118 18     Temp 06/11/19 1118 99.3 F (37.4 C)     Temp Source 06/11/19 1118 Oral     SpO2 06/11/19 1118 97 %     Weight 06/11/19 1119 241 lb (109.3 kg)     Height 06/11/19 1119 6\' 1"  (1.854 m)     Head Circumference --      Peak Flow --      Pain Score 06/11/19 1119 9     Pain Loc --      Pain Edu? --      Excl. in [place]? --     Constitutional: Alert and oriented. Well appearing and in no acute distress. Cardiovascular: Normal rate, regular rhythm. Grossly normal heart sounds.  Good peripheral circulation.  Elevated blood pressure. Respiratory: Normal respiratory effort.  No retractions. Lungs CTAB. Neurologic:  Normal speech and language. No  gross focal neurologic deficits are appreciated. No gait instability. Skin:  Skin is warm, dry and intact.  Nodule lesion on erythematous base left buttocks.  Area is fluctuant.  Psychiatric: Mood and affect are normal. Speech and behavior are normal.  ____________________________________________   LABS (all labs ordered are listed, but only abnormal results are displayed)  Labs Reviewed - No data to display ____________________________________________  EKG   ____________________________________________  RADIOLOGY  ED MD interpretation:    Official radiology report(s): No results found.  ____________________________________________   PROCEDURES  Procedure(s) performed (including Critical Care):  Marland KitchenMarland KitchenIncision and Drainage  Date/Time: 06/11/2019 12:32 PM Performed by: Sable Feil, PA-C Authorized by: Sable Feil, PA-C   Consent:    Consent obtained:  Verbal   Consent given by:  Patient   Risks discussed:  Bleeding, incomplete drainage, pain and infection Location:    Type:  Abscess   Location:  Lower extremity   Lower extremity location:  Buttock   Buttock location:  L buttock Pre-procedure details:    Skin preparation:  Betadine Anesthesia (see MAR for exact dosages):    Anesthesia method:  Local infiltration   Local anesthetic:  Lidocaine 1% WITH epi Procedure type:    Complexity:  Simple Procedure details:    Needle aspiration: no     Incision types:  Single with marsupialization   Incision depth:  Dermal   Scalpel blade:  11   Drainage:  Purulent   Drainage amount:  Copious   Wound treatment:  Wound left open   Packing materials:  1/4 in iodoform gauze   Amount 1/4" iodoform:  6 inches Post-procedure details:    Patient tolerance of procedure:  Tolerated well, no immediate complications     ____________________________________________   INITIAL IMPRESSION / ASSESSMENT AND PLAN / ED COURSE  As part of my medical decision making, I  reviewed the following data within the [place] was evaluated in Emergency Department on 06/11/2019 for the symptoms described in the history of present illness. He was evaluated in the context of the global COVID-19 pandemic, which necessitated consideration that the patient might be at risk for infection with the SARS-CoV-2 virus that causes COVID-19. Institutional protocols and algorithms that pertain to the evaluation of patients at risk for COVID-19 are in a state of rapid change based on information released by regulatory bodies including the CDC and federal and state organizations. These policies and algorithms were followed during the patient's care in the ED.    Patient presents with abscess to the left buttocks.  Discussed rationale for incision and drainage.  See procedure note.  Patient given discharge care instruct advised take medication as directed.  Patient return back in 2 days for wound check.   ____________________________________________   FINAL CLINICAL IMPRESSION(S) / ED DIAGNOSES  Final diagnoses:  Abscess of buttock, left     ED Discharge Orders         Ordered    traMADol (ULTRAM) 50 MG tablet  Every 12 hours PRN     06/11/19 1228    sulfamethoxazole-trimethoprim (BACTRIM DS) 800-160 MG tablet  2 times daily     06/11/19 1228           Note:  This document was prepared using Dragon voice recognition software and may include unintentional dictation errors.    Sable Feil, PA-C 06/11/19 1235    Harvest Dark, MD 06/11/19 1300

## 2019-06-13 ENCOUNTER — Other Ambulatory Visit: Payer: Self-pay

## 2019-06-13 ENCOUNTER — Emergency Department
Admission: EM | Admit: 2019-06-13 | Discharge: 2019-06-13 | Disposition: A | Discharge status: Home / Self Care | Payer: Medicare HMO | Attending: Emergency Medicine | Admitting: Emergency Medicine

## 2019-06-13 ENCOUNTER — Encounter: Payer: Self-pay | Admitting: Emergency Medicine

## 2019-06-13 DIAGNOSIS — Z87891 Personal history of nicotine dependence: Secondary | ICD-10-CM | POA: Diagnosis not present

## 2019-06-13 DIAGNOSIS — Z7984 Long term (current) use of oral hypoglycemic drugs: Secondary | ICD-10-CM | POA: Diagnosis not present

## 2019-06-13 DIAGNOSIS — I10 Essential (primary) hypertension: Secondary | ICD-10-CM | POA: Diagnosis not present

## 2019-06-13 DIAGNOSIS — E119 Type 2 diabetes mellitus without complications: Secondary | ICD-10-CM | POA: Diagnosis not present

## 2019-06-13 DIAGNOSIS — Z5189 Encounter for other specified aftercare: Secondary | ICD-10-CM

## 2019-06-13 DIAGNOSIS — Z79899 Other long term (current) drug therapy: Secondary | ICD-10-CM | POA: Diagnosis not present

## 2019-06-13 DIAGNOSIS — Z4800 Encounter for change or removal of nonsurgical wound dressing: Secondary | ICD-10-CM | POA: Diagnosis present

## 2019-06-13 DIAGNOSIS — Z7982 Long term (current) use of aspirin: Secondary | ICD-10-CM | POA: Insufficient documentation

## 2019-06-13 NOTE — ED Triage Notes (Signed)
Pt to ED via POV for wound recheck. Pt states that he had an abscess on his left hip drained 2 days ago and was told to come back today to have packing removed and have area checked. Pt is in NAD at this time.

## 2019-06-13 NOTE — ED Notes (Signed)
Pt alert and oriented X4, active, cooperative, pt in NAD. RR even and unlabored, color WNL.  Pt informed to return if any life threatening symptoms occur.  Discharge and followup instructions reviewed. Ambulates safely. 

## 2019-06-13 NOTE — Discharge Instructions (Addendum)
Follow discharge care instruction and take all medication as directed.

## 2019-06-13 NOTE — ED Provider Notes (Signed)
[facility] Emergency Department Provider Note   ____________________________________________   First MD Initiated Contact with Patient 06/13/19 1057     (approximate)  I have reviewed the triage vital signs and the nursing notes.   HISTORY  Chief Complaint Wound Check    HPI Victor Dixon is a 78 y.o. male patient returns today for wound check secondary to incision and drainage of abscess to the left buttocks.  Patient was no complaints.  Patient currently taking antibiotics and pain medication as directed.  Patient rates his pain as a 4/10.  Patient described the pain is "sore.  Patient is able to sit and lay on affected side.         Past Medical History:  Diagnosis Date  . Anemia   . Diabetes mellitus without complication ([place])   . GERD (gastroesophageal reflux disease)   . Hypertension     Patient Active Problem List   Diagnosis Date Noted  . PAD (peripheral artery disease) ([place]) 11/04/2018  . Chronic deep vein thrombosis (DVT) ([place]) 07/02/2017  . Bilateral lower extremity edema 06/27/2017  . Anemia 02/21/2017  . Monoclonal gammopathy of unknown significance (MGUS) 02/21/2017  . Pneumonia 12/31/2016  . Atherosclerosis of native arteries of extremity with intermittent claudication ([place]) 12/28/2016  . Essential hypertension 12/28/2016  . Hyperlipidemia 12/28/2016  . Type 2 diabetes mellitus ([place]) 12/28/2016  . GERD (gastroesophageal reflux disease) 12/28/2016  . Chronic kidney disease 10/17/2016    Past Surgical History:  Procedure Laterality Date  . APPENDECTOMY    . CAROTID ARTERY ANGIOPLASTY Right   . PERIPHERAL VASCULAR CATHETERIZATION Left 08/15/2016   Procedure: Lower Extremity Angiography;  Surgeon: Katha Cabal, MD;  Location: [place] [facility];  Service: Cardiovascular;  Laterality: Left;  . PERIPHERAL VASCULAR CATHETERIZATION  08/15/2016   Procedure: Lower Extremity Intervention;  Surgeon: Katha Cabal, MD;   Location: [place] [facility];  Service: Cardiovascular;;    Prior to Admission medications   Medication Sig Start Date End Date Taking? Authorizing Provider  aspirin EC 81 MG tablet Take 81 mg by mouth daily.    [provider]  Calcium Carbonate-Vitamin D (CALCIUM HIGH POTENCY/VITAMIN D) 600-200 MG-UNIT TABS Take by mouth. 05/31/17 12/25/18  [provider]  carvedilol (COREG) 6.25 MG tablet Take 6.25 mg by mouth 2 (two) times daily with a meal.    [provider]  cilostazol (PLETAL) 100 MG tablet Take 100 mg by mouth 2 (two) times daily.    [provider]  Cyanocobalamin (VITAMIN B 12) 250 MCG LOZG Take 500 mcg by mouth every morning.    [provider]  ELIQUIS 5 MG TABS tablet Take 1 tablet by mouth twice daily 03/03/19   Kris Hartmann, NP  ferrous sulfate 325 (65 FE) MG tablet Take 325 mg by mouth daily with breakfast.    [provider]  finasteride (PROSCAR) 5 MG tablet Take 5 mg by mouth daily.    [provider]  gabapentin (NEURONTIN) 300 MG capsule Take 300 mg by mouth 3 (three) times daily.    [provider]  glimepiride (AMARYL) 2 MG tablet Take 2 mg by mouth daily with breakfast.    [provider]  losartan-hydrochlorothiazide (HYZAAR) 100-25 MG tablet Take by mouth. 10/26/17   [provider]  metFORMIN (GLUCOPHAGE-XR) 500 MG 24 hr tablet Take 500 mg by mouth 2 (two) times daily with a meal.    [provider]  pantoprazole (PROTONIX) 40 MG  tablet Take 40 mg by mouth daily.    [provider]  predniSONE (DELTASONE) 1 MG tablet Take 3 tablets by mouth daily. 05/02/18   [provider]  senna (SENOKOT) 8.6 MG tablet Take 1 tablet by mouth daily.    [provider]  simvastatin (ZOCOR) 40 MG tablet Take 40 mg by mouth daily.    [provider]  sulfamethoxazole-trimethoprim (BACTRIM DS) 800-160 MG tablet Take 1 tablet by mouth 2 (two) times  daily. 06/11/19   Sable Feil, PA-C  traMADol (ULTRAM) 50 MG tablet Take 1 tablet (50 mg total) by mouth every 12 (twelve) hours as needed. 06/11/19   Sable Feil, PA-C    Allergies Patient has no known allergies.  Family History  Problem Relation Age of Onset  . Cancer Sister     Social History Social History   Tobacco Use  . Smoking status: Former Smoker    Packs/day: 0.50    Years: 30.00    Pack years: 15.00    Quit date: 02/22/2007    Years since quitting: 12.3  . Smokeless tobacco: Never Used  Substance Use Topics  . Alcohol use: No  . Drug use: No    Review of Systems Constitutional: No fever/chills Eyes: No visual changes. ENT: No sore throat. Cardiovascular: Denies chest pain. Respiratory: Denies shortness of breath. Gastrointestinal: No abdominal pain.  No nausea, no vomiting.  No diarrhea.  No constipation. Genitourinary: Negative for dysuria. Musculoskeletal: Negative for back pain. Skin: Negative for rash. Neurological: Negative for headaches, focal weakness or numbness. Endocrine:  Diabetes and hypertension. Hematological/Lymphatic:  Anemia.  ____________________________________________   PHYSICAL EXAM:  VITAL SIGNS: ED Triage Vitals [06/13/19 1008]  Enc Vitals Group     BP (!) 159/56     Pulse Rate 94     Resp 16     Temp 99.2 F (37.3 C)     Temp Source Oral     SpO2 96 %     Weight      Height      Head Circumference      Peak Flow      Pain Score 4     Pain Loc      Pain Edu?      Excl. in [place]?    Constitutional: Alert and oriented. Well appearing and in no acute distress. Cardiovascular: Normal rate, regular rhythm. Grossly normal heart sounds.  Good peripheral circulation.  Elevated blood pressure. Respiratory: Normal respiratory effort.  No retractions. Lungs CTAB. Skin: Soft and edema and erythema of the left buttocks. Psychiatric: Mood and affect are normal. Speech and behavior are normal.   ____________________________________________   LABS (all labs ordered are listed, but only abnormal results are displayed)  Labs Reviewed - No data to display ____________________________________________  EKG   ____________________________________________  RADIOLOGY  ED MD interpretation:    Official radiology report(s): No results found.  ____________________________________________   PROCEDURES  Procedure(s) performed (including Critical Care):  Procedures   ____________________________________________   INITIAL IMPRESSION / ASSESSMENT AND PLAN / ED COURSE  As part of my medical decision making, I reviewed the following data within the [place] was evaluated in Emergency Department on 06/13/2019 for the symptoms described in the history of present illness. He was evaluated in the context of the global COVID-19 pandemic, which necessitated consideration that the patient might be at risk for infection with the SARS-CoV-2 virus that  causes COVID-19. Institutional protocols and algorithms that pertain to the evaluation of patients at risk for COVID-19 are in a state of rapid change based on information released by regulatory bodies including the CDC and federal and state organizations. These policies and algorithms were followed during the patient's care in the ED.   Patient presents for wound check status post I&D 2 days ago.  Patient voices no complaints.  Packing material was removed and lesion was irrigated with clear return.  Patient wound was redressed and patient given discharge care instruction.  Advised continue medications are finished.  Return to ED if condition worsens.      ____________________________________________   FINAL CLINICAL IMPRESSION(S) / ED DIAGNOSES  Final diagnoses:  Wound check, abscess     ED Discharge Orders    None       Note:  This document was prepared using Dragon voice  recognition software and may include unintentional dictation errors.    Sable Feil, PA-C 06/13/19 1106    Arta Silence, MD 06/13/19 [phone]

## 2019-06-19 NOTE — Progress Notes (Signed)
[facility]  [address], [address] [place], [place] [address] Phone: [phone]  Fax: [phone]   Clinic Day:  06/24/2019  Referring physician: Ezequiel Kayser, MD  Chief Complaint: Victor Dixon is a 78 y.o. male with anemia of chronic disease, monoclonal gammopathy of unknown significance (MGUS), and bilateral lower extremity DVTs who is seen for 6 month assessment.  HPI: The patient was last seen in the medical oncology clinic on 12/25/2018. At that time, he was doing well. He continued to have symptoms from peripheral vascular disease. Hematocrit 34.2, hemoglobin 10.3, MCV 90.5. Ferritin 182. B-12 499. Folate 17.1. Sed rate 57. M-spike was 0.3 gm/dL.   Labs on 03/25/2019: hematocrit 35.0, hemoglobin 10.5, MCV 89.7, platelets 208,000, WBC 7,400.   The patient was presented to the ER for an abscess of the left buttock x 1 week on 06/11/2019. He reported an "achy" pain (9/10). Patient had similar episode last year. An incision and drainage was preformed.   During the interim, the patient feels "pretty good". He notes that his legs burn when walking. He notes his back pain is unchanged since last visit 12/25/2018. He receives physical therapy at the [facility]. He denies any blood in the stool or melena. His diabetes is being well managed. His weight is up 4 pounds.   He notes abscess on left buttock is healing s/p incision and drainage on 06/11/2019. He notes some itching in this area.    Past Medical History:  Diagnosis Date  . Anemia   . Diabetes mellitus without complication ([place])   . GERD (gastroesophageal reflux disease)   . Hypertension     Past Surgical History:  Procedure Laterality Date  . APPENDECTOMY    . CAROTID ARTERY ANGIOPLASTY Right   . PERIPHERAL VASCULAR CATHETERIZATION Left 08/15/2016   Procedure: Lower Extremity Angiography;  Surgeon: Katha Cabal, MD;  Location: [facility];  Service: Cardiovascular;   Laterality: Left;  . PERIPHERAL VASCULAR CATHETERIZATION  08/15/2016   Procedure: Lower Extremity Intervention;  Surgeon: Katha Cabal, MD;  Location: [facility];  Service: Cardiovascular;;    Family History  Problem Relation Age of Onset  . Cancer Sister     Social History:  reports that he quit smoking about 12 years ago. He has a 15.00 pack-year smoking history. He has never used smokeless tobacco. He reports that he does not drink alcohol or use drugs. He smoked a 1/2 pack a day since age 63.  He stopped smoking 10 years ago.  The patient's wife died in January 21, 2017. The patient is alone today.  Allergies: No Known Allergies  Current Medications: Current Outpatient Medications  Medication Sig Dispense Refill  . aspirin EC 81 MG tablet Take 81 mg by mouth daily.    . Calcium Carbonate-Vitamin D (CALCIUM HIGH POTENCY/VITAMIN D) 600-200 MG-UNIT TABS Take 1 tablet by mouth daily.     . carvedilol (COREG) 6.25 MG tablet Take 6.25 mg by mouth 2 (two) times daily with a meal.    . cilostazol (PLETAL) 100 MG tablet Take 100 mg by mouth 2 (two) times daily.    . Cyanocobalamin (VITAMIN B 12) 250 MCG LOZG Take 500 mcg by mouth every morning.    Marland Kitchen ELIQUIS 5 MG TABS tablet Take 1 tablet by mouth twice daily 60 tablet 0  . finasteride (PROSCAR) 5 MG tablet Take 5 mg by mouth daily.    Marland Kitchen gabapentin (NEURONTIN) 300 MG capsule Take 300 mg by mouth 3 (  three) times daily.    Marland Kitchen glimepiride (AMARYL) 2 MG tablet Take 2 mg by mouth daily with breakfast.    . hydrochlorothiazide (HYDRODIURIL) 25 MG tablet Take 25 mg by mouth daily.    Marland Kitchen losartan (COZAAR) 100 MG tablet Take 100 mg by mouth daily.    . metFORMIN (GLUCOPHAGE-XR) 500 MG 24 hr tablet Take 500 mg by mouth 2 (two) times daily with a meal.    . ONETOUCH ULTRA test strip USE 1 STRIP TO CHECK GLUCOSE ONCE DAILY AS DIRECTED    . pantoprazole (PROTONIX) 40 MG tablet Take 40 mg by mouth daily.    . predniSONE (DELTASONE) 1 MG tablet Take 3  tablets by mouth daily.    Marland Kitchen senna (SENOKOT) 8.6 MG tablet Take 1 tablet by mouth daily.    . simvastatin (ZOCOR) 40 MG tablet Take 40 mg by mouth daily.     No current facility-administered medications for this visit.    Facility-Administered Medications Ordered in Other Visits  Medication Dose Route Frequency Provider Last Rate Last Dose  . heparin lock flush 100 unit/mL  500 Units Intravenous Once Monia Sabal, PA-C        Review of Systems  Constitutional: Negative for chills, diaphoresis, fever, malaise/fatigue and weight loss (up 4 lbs.).       Feels "pretty good".  HENT: Negative.  Negative for congestion, ear pain, nosebleeds, sinus pain and sore throat.   Eyes: Negative.  Negative for blurred vision, double vision and photophobia.  Respiratory: Negative.  Negative for cough, hemoptysis, sputum production and shortness of breath.   Cardiovascular: Positive for claudication ("burning" with ambulation). Negative for chest pain, palpitations, orthopnea, leg swelling and PND.       Peripheral vascular disease.  Gastrointestinal: Negative for abdominal pain, blood in stool, constipation, diarrhea, melena, nausea and vomiting.       Due for colonoscopy.  Genitourinary: Negative.  Negative for dysuria, frequency, hematuria and urgency.  Musculoskeletal: Positive for back pain (chronic). Negative for falls and joint pain.  Skin: Positive for itching (left buttock). Negative for rash.       Left buttock abscess healed.  Neurological: Negative for dizziness, tremors, sensory change, speech change, focal weakness, weakness and headaches.  Endo/Heme/Allergies: Negative.  Does not bruise/bleed easily.       Diabetes.  Blood sugar "good".  Psychiatric/Behavioral: Negative.  Negative for depression, hallucinations and memory loss. The patient is not nervous/anxious and does not have insomnia.   All other systems reviewed and are negative.  Performance status (ECOG): 1  Vitals Blood  pressure 119/61, pulse 70, temperature 97.7 F (36.5 C), temperature source Oral, resp. rate 18, weight 235 lb 7.2 oz (106.8 kg), SpO2 99 %.  Physical Exam  Constitutional: He is oriented to person, place, and time. He appears well-developed and well-nourished. No distress.  HENT:  Head: Normocephalic and atraumatic.  Mouth/Throat: Oropharynx is clear and moist. No oropharyngeal exudate.  Wearing a camo cap and mask.  Gray hair. Dentures.   Eyes: Pupils are equal, round, and reactive to light. Conjunctivae and EOM are normal. No scleral icterus.  Glasses. Brown eyes. Bilateral arcus.  Neck: Normal range of motion. Neck supple. No JVD present.  Cardiovascular: Normal rate, regular rhythm and normal heart sounds.  No murmur heard. Pulmonary/Chest: Effort normal and breath sounds normal. No respiratory distress. He has no wheezes. He has no rales. He exhibits no tenderness.  Abdominal: Soft. Bowel sounds are normal. He exhibits no mass. There is no  abdominal tenderness. There is no rebound and no guarding.  Musculoskeletal: Normal range of motion.        General: Edema (chronic) present. No tenderness.  Lymphadenopathy:    He has no cervical adenopathy.    He has no axillary adenopathy.       Right: No supraclavicular adenopathy present.       Left: No supraclavicular adenopathy present.  Neurological: He is alert and oriented to person, place, and time.  Skin: Skin is warm and dry. No rash noted. He is not diaphoretic. No erythema. No pallor.  Site of left buttock abscess healed without erythema or increased wamth.  Psychiatric: He has a normal mood and affect. His behavior is normal. Judgment and thought content normal.  Nursing note and vitals reviewed.   Appointment on 06/24/2019  Component Date Value Ref Range Status  . Sodium 06/24/2019 132* 135 - 145 mmol/L Final  . Potassium 06/24/2019 4.8  3.5 - 5.1 mmol/L Final  . Chloride 06/24/2019 99  98 - 111 mmol/L Final  . CO2  06/24/2019 24  22 - 32 mmol/L Final  . Glucose, Bld 06/24/2019 166* 70 - 99 mg/dL Final  . BUN 06/24/2019 19  8 - 23 mg/dL Final  . Creatinine, Ser 06/24/2019 1.06  0.61 - 1.24 mg/dL Final  . Calcium 06/24/2019 9.4  8.9 - 10.3 mg/dL Final  . Total Protein 06/24/2019 7.6  6.5 - 8.1 g/dL Final  . Albumin 06/24/2019 4.1  3.5 - 5.0 g/dL Final  . AST 06/24/2019 22  15 - 41 U/L Final  . ALT 06/24/2019 16  0 - 44 U/L Final  . Alkaline Phosphatase 06/24/2019 63  38 - 126 U/L Final  . Total Bilirubin 06/24/2019 0.4  0.3 - 1.2 mg/dL Final  . GFR calc non Af Amer 06/24/2019 >60  >60 mL/min Final  . GFR calc Af Amer 06/24/2019 >60  >60 mL/min Final  . Anion gap 06/24/2019 9  5 - 15 Final   Performed at [facility], [address] [place], [place] [address]  . WBC 06/24/2019 6.4  4.0 - 10.5 K/uL Final  . RBC 06/24/2019 3.58* 4.22 - 5.81 MIL/uL Final  . Hemoglobin 06/24/2019 9.5* 13.0 - 17.0 g/dL Final  . HCT 06/24/2019 31.1* 39.0 - 52.0 % Final  . MCV 06/24/2019 86.9  80.0 - 100.0 fL Final  . MCH 06/24/2019 26.5  26.0 - 34.0 pg Final  . MCHC 06/24/2019 30.5  30.0 - 36.0 g/dL Final  . RDW 06/24/2019 15.5  11.5 - 15.5 % Final  . Platelets 06/24/2019 253  150 - 400 K/uL Final  . nRBC 06/24/2019 0.0  0.0 - 0.2 % Final  . Neutrophils Relative % 06/24/2019 47  % Final  . Neutro Abs 06/24/2019 3.0  1.7 - 7.7 K/uL Final  . Lymphocytes Relative 06/24/2019 37  % Final  . Lymphs Abs 06/24/2019 2.4  0.7 - 4.0 K/uL Final  . Monocytes Relative 06/24/2019 10  % Final  . Monocytes Absolute 06/24/2019 0.6  0.1 - 1.0 K/uL Final  . Eosinophils Relative 06/24/2019 5  % Final  . Eosinophils Absolute 06/24/2019 0.3  0.0 - 0.5 K/uL Final  . Basophils Relative 06/24/2019 1  % Final  . Basophils Absolute 06/24/2019 0.1  0.0 - 0.1 K/uL Final  . Immature Granulocytes 06/24/2019 0  % Final  . Abs Immature Granulocytes 06/24/2019 0.02  0.00 - 0.07 K/uL Final   Performed at [facility],  [address], [place], [place] [address]    Assessment:  Victor Dixon is a 78 y.o. male with anemia of chronic disease.  He has had a normocytic anemia since 10/19/2016.  Labs reveal a progressive normocytic anemia since 10/19/2016.  Hematocrit has decreased from 37.9 to 28.5 (hemoglobin 11.7 to 8.4).  He has been on oral iron and B12 x 1 year.  He has a monoclonal gammopathy of unknown significance (MGUS).  Diet is good.  His last colonoscopy was > 5 years ago.  He denies any history of gastric ulcer.  He denies any melena or hematochezia.  Labs on 01/26/2017 revealed a normal B12, RF.  Ferritin was elevated (acute phase reactant). TIBC was 244 (low) c/w anemia of chronic disease.  Reticulocyte count was 2.96%.  CRP was 63.6 (high).   Work-up on 02/21/2017 revealed a 0.3 gm/dL IgG monoclonal protein with kappa light chain specificity.  Free light chain ratio was 1.27 (normal).  24 hour UPEP was negative on 02/27/2017.  Hematocrit was 26.0, hemoglobin 8.2, MCV 81.2, platelets 357,000, WBC 6800 with an ANC of 4400.  Retic was 1.1% (inappropriately low).  Normal studies included: creatinine, calcium, Coombs, folate, ANA, and immunoglobulins.  Ferritin was 372.  Iron studies included a saturation of 7% and a TIBC of 217 (low).  Sed rate was 119.  Bone survey on 02/22/2017 revealed no lytic or blastic bony lesions. There was multilevel degenerative disc disease throughout the spine.  There was mild symmetric hip joint space narrowing bilaterally consistent with osteoarthritis.  Bone marrow aspirate and biopsy on 03/14/2017 revealed a slightly hypercellular marrow with trilineage hematopoiesis.  There was no significant dyspoiesis.  There was slight plasmacytosis (5%).  Plasma cells displayed staining for kappa and lambda light chians with slight kappa excess.  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  Cytogenetics were normal (46, XY).  SPEP has been  followed: 0.6 gm/dL on 01/26/2017, 0.3 on 02/21/2017, 0.3 on 06/27/2017, 0.5 on 12/26/2017, 0.5 on 06/26/2018, 0.3 on 12/25/2018, and 0.5 on 06/24/2019.  He has polymyalgia rheumatica.  With a prednisone pulse, joint pain dramatically improved.  ESR and CRP normalized after initiation of prednisone.  Anemia has improved.    He has peripheral vascular disease and is followed by vascular surgery.  Bilateral lower extremity duplex on 06/27/2017 revealed bilateral DVTs.  There was bilateral femoral venous duplication with occlusive thrombosis in a single femoral vein bilaterally.  There was partial occlusive thrombus left popliteal vein.  He is on Eliquis.  Symptomatically, he feels "pretty good".  During the interim, he had an abscess incised and drained.  Exam is unremarkable.  Plan: 1.   Labs today: CBC with diff, CMP, SPEP, ferritin, sed rate. 2.   Monoclonal gammopathy of unknown significance (MGUS) Clinically, he is doing well.   M-spike is 0.5 gm/dL.   Continue surveillance every 6 months. 3.   Lower extremity DVT  Patient on chronic anticoagulation (Eliquis). Patient is followed by Dr. Delana Meyer and vascular surgery. 4.   Anemia of chronic disease Hemoglobin fluctuating over the past 6 months.   Hematocrit 31.1.  Hemoglobin 9.5.  MCV 86.9. Ferritin 377. Sed rate 53. B12 and folate were normal on 12/25/2018  Retic 1.8% (inappropriately low).   Patient denies any melena, hematochezia or hematuria. Continue monitoring. 5.   RTC in 3 months for labs (CBC with diff). 6.   RTC in 6 months for MD assessment and labs (CBC with diff, CMP, SPEP, ferritin, sed rate).  I discussed the assessment and treatment plan with the patient.  The patient was provided an opportunity to ask questions and all were answered.  The patient agreed with the plan and demonstrated an understanding of the instructions.  The patient was advised to call back if the symptoms worsen or if the condition fails to improve  as anticipated.  I provided 14 minutes of face-to-face time during this this encounter and > 50% was spent counseling as documented under my assessment and plan.    Lequita Asal, MD, PhD    06/24/2019, 9:43 AM  I, Selena Batten, am acting as scribe for [employer]. Mike Gip, MD, PhD.  I, Melissa C. Mike Gip, MD, have reviewed the above documentation for accuracy and completeness, and I agree with the above.

## 2019-06-23 ENCOUNTER — Other Ambulatory Visit: Payer: Self-pay

## 2019-06-24 ENCOUNTER — Other Ambulatory Visit: Payer: Self-pay

## 2019-06-24 ENCOUNTER — Inpatient Hospital Stay: Payer: Medicare HMO | Attending: Hematology and Oncology | Admitting: Hematology and Oncology

## 2019-06-24 ENCOUNTER — Encounter: Payer: Self-pay | Admitting: Hematology and Oncology

## 2019-06-24 ENCOUNTER — Inpatient Hospital Stay: Payer: Medicare HMO

## 2019-06-24 VITALS — BP 119/61 | HR 70 | Temp 97.7°F | Resp 18 | Wt 235.5 lb

## 2019-06-24 DIAGNOSIS — Z86718 Personal history of other venous thrombosis and embolism: Secondary | ICD-10-CM | POA: Insufficient documentation

## 2019-06-24 DIAGNOSIS — I10 Essential (primary) hypertension: Secondary | ICD-10-CM | POA: Insufficient documentation

## 2019-06-24 DIAGNOSIS — I82533 Chronic embolism and thrombosis of popliteal vein, bilateral: Secondary | ICD-10-CM | POA: Diagnosis not present

## 2019-06-24 DIAGNOSIS — Z7982 Long term (current) use of aspirin: Secondary | ICD-10-CM | POA: Insufficient documentation

## 2019-06-24 DIAGNOSIS — Z79899 Other long term (current) drug therapy: Secondary | ICD-10-CM | POA: Insufficient documentation

## 2019-06-24 DIAGNOSIS — D472 Monoclonal gammopathy: Secondary | ICD-10-CM | POA: Diagnosis not present

## 2019-06-24 DIAGNOSIS — Z87891 Personal history of nicotine dependence: Secondary | ICD-10-CM | POA: Insufficient documentation

## 2019-06-24 DIAGNOSIS — D6489 Other specified anemias: Secondary | ICD-10-CM

## 2019-06-24 DIAGNOSIS — Z7901 Long term (current) use of anticoagulants: Secondary | ICD-10-CM | POA: Insufficient documentation

## 2019-06-24 DIAGNOSIS — Z7984 Long term (current) use of oral hypoglycemic drugs: Secondary | ICD-10-CM | POA: Insufficient documentation

## 2019-06-24 DIAGNOSIS — E119 Type 2 diabetes mellitus without complications: Secondary | ICD-10-CM | POA: Insufficient documentation

## 2019-06-24 LAB — CBC WITH DIFFERENTIAL/PLATELET
Abs Immature Granulocytes: 0.02 10*3/uL (ref 0.00–0.07)
Basophils Absolute: 0.1 10*3/uL (ref 0.0–0.1)
Basophils Relative: 1 %
Eosinophils Absolute: 0.3 10*3/uL (ref 0.0–0.5)
Eosinophils Relative: 5 %
HCT: 31.1 % — ABNORMAL LOW (ref 39.0–52.0)
Hemoglobin: 9.5 g/dL — ABNORMAL LOW (ref 13.0–17.0)
Immature Granulocytes: 0 %
Lymphocytes Relative: 37 %
Lymphs Abs: 2.4 10*3/uL (ref 0.7–4.0)
MCH: 26.5 pg (ref 26.0–34.0)
MCHC: 30.5 g/dL (ref 30.0–36.0)
MCV: 86.9 fL (ref 80.0–100.0)
Monocytes Absolute: 0.6 10*3/uL (ref 0.1–1.0)
Monocytes Relative: 10 %
Neutro Abs: 3 10*3/uL (ref 1.7–7.7)
Neutrophils Relative %: 47 %
Platelets: 253 10*3/uL (ref 150–400)
RBC: 3.58 MIL/uL — ABNORMAL LOW (ref 4.22–5.81)
RDW: 15.5 % (ref 11.5–15.5)
WBC: 6.4 10*3/uL (ref 4.0–10.5)
nRBC: 0 % (ref 0.0–0.2)

## 2019-06-24 LAB — COMPREHENSIVE METABOLIC PANEL
ALT: 16 U/L (ref 0–44)
AST: 22 U/L (ref 15–41)
Albumin: 4.1 g/dL (ref 3.5–5.0)
Alkaline Phosphatase: 63 U/L (ref 38–126)
Anion gap: 9 (ref 5–15)
BUN: 19 mg/dL (ref 8–23)
CO2: 24 mmol/L (ref 22–32)
Calcium: 9.4 mg/dL (ref 8.9–10.3)
Chloride: 99 mmol/L (ref 98–111)
Creatinine, Ser: 1.06 mg/dL (ref 0.61–1.24)
GFR calc Af Amer: >60 mL/min (ref >60)
GFR calc non Af Amer: >60 mL/min (ref >60)
Glucose, Bld: 166 mg/dL — ABNORMAL HIGH (ref 70–99)
Potassium: 4.8 mmol/L (ref 3.5–5.1)
Sodium: 132 mmol/L — ABNORMAL LOW (ref 135–145)
Total Bilirubin: 0.4 mg/dL (ref 0.3–1.2)
Total Protein: 7.6 g/dL (ref 6.5–8.1)

## 2019-06-24 LAB — RETICULOCYTES
Immature Retic Fract: 29.3 % — ABNORMAL HIGH (ref 2.3–15.9)
RBC.: 3.58 MIL/uL — ABNORMAL LOW (ref 4.22–5.81)
Retic Count, Absolute: 62.7 10*3/uL (ref 19.0–186.0)
Retic Ct Pct: 1.8 % (ref 0.4–3.1)

## 2019-06-24 LAB — FERRITIN: Ferritin: 377 ng/mL — ABNORMAL HIGH (ref 24–336)

## 2019-06-24 LAB — SEDIMENTATION RATE: Sed Rate: 53 mm/hr — ABNORMAL HIGH (ref 0–20)

## 2019-06-24 NOTE — Progress Notes (Signed)
Pt here for follow up. Denies any concerns.  

## 2019-06-25 LAB — PROTEIN ELECTROPHORESIS, SERUM
A/G Ratio: 1.2 (ref 0.7–1.7)
Albumin ELP: 3.7 g/dL (ref 2.9–4.4)
Alpha-1-Globulin: 0.2 g/dL (ref 0.0–0.4)
Alpha-2-Globulin: 0.8 g/dL (ref 0.4–1.0)
Beta Globulin: 1 g/dL (ref 0.7–1.3)
Gamma Globulin: 1 g/dL (ref 0.4–1.8)
Globulin, Total: 3.1 g/dL (ref 2.2–3.9)
M-Spike, %: 0.5 g/dL — ABNORMAL HIGH
Total Protein ELP: 6.8 g/dL (ref 6.0–8.5)

## 2019-07-16 DIAGNOSIS — M6281 Muscle weakness (generalized): Secondary | ICD-10-CM | POA: Diagnosis not present

## 2019-07-16 DIAGNOSIS — M545 Low back pain: Secondary | ICD-10-CM | POA: Diagnosis not present

## 2019-07-16 DIAGNOSIS — G8929 Other chronic pain: Secondary | ICD-10-CM | POA: Diagnosis not present

## 2019-07-31 DIAGNOSIS — M47816 Spondylosis without myelopathy or radiculopathy, lumbar region: Secondary | ICD-10-CM | POA: Diagnosis not present

## 2019-07-31 DIAGNOSIS — M12811 Other specific arthropathies, not elsewhere classified, right shoulder: Secondary | ICD-10-CM | POA: Diagnosis not present

## 2019-11-11 DIAGNOSIS — E1140 Type 2 diabetes mellitus with diabetic neuropathy, unspecified: Secondary | ICD-10-CM | POA: Diagnosis not present

## 2019-11-11 DIAGNOSIS — E1169 Type 2 diabetes mellitus with other specified complication: Secondary | ICD-10-CM | POA: Diagnosis not present

## 2019-11-11 DIAGNOSIS — I10 Essential (primary) hypertension: Secondary | ICD-10-CM | POA: Diagnosis not present

## 2019-11-11 DIAGNOSIS — E1159 Type 2 diabetes mellitus with other circulatory complications: Secondary | ICD-10-CM | POA: Diagnosis not present

## 2019-11-11 DIAGNOSIS — E538 Deficiency of other specified B group vitamins: Secondary | ICD-10-CM | POA: Diagnosis not present

## 2019-11-11 DIAGNOSIS — Z79899 Other long term (current) drug therapy: Secondary | ICD-10-CM | POA: Diagnosis not present

## 2019-11-11 DIAGNOSIS — E1142 Type 2 diabetes mellitus with diabetic polyneuropathy: Secondary | ICD-10-CM | POA: Diagnosis not present

## 2019-11-11 DIAGNOSIS — Z86718 Personal history of other venous thrombosis and embolism: Secondary | ICD-10-CM | POA: Diagnosis not present

## 2019-11-11 DIAGNOSIS — E785 Hyperlipidemia, unspecified: Secondary | ICD-10-CM | POA: Diagnosis not present

## 2019-11-11 DIAGNOSIS — D472 Monoclonal gammopathy: Secondary | ICD-10-CM | POA: Diagnosis not present

## 2019-12-19 DIAGNOSIS — H2513 Age-related nuclear cataract, bilateral: Secondary | ICD-10-CM | POA: Diagnosis not present

## 2019-12-26 DIAGNOSIS — E1159 Type 2 diabetes mellitus with other circulatory complications: Secondary | ICD-10-CM | POA: Diagnosis not present

## 2019-12-26 DIAGNOSIS — H25042 Posterior subcapsular polar age-related cataract, left eye: Secondary | ICD-10-CM | POA: Diagnosis not present

## 2019-12-30 ENCOUNTER — Other Ambulatory Visit: Payer: Self-pay

## 2019-12-30 ENCOUNTER — Encounter: Payer: Self-pay | Admitting: Ophthalmology

## 2020-01-01 NOTE — Discharge Instructions (Signed)

## 2020-01-02 ENCOUNTER — Other Ambulatory Visit
Admission: RE | Admit: 2020-01-02 | Discharge: 2020-01-02 | Disposition: A | Discharge status: Home / Self Care | Payer: Medicare HMO | Source: Ambulatory Visit | Attending: Ophthalmology | Admitting: Ophthalmology

## 2020-01-02 DIAGNOSIS — Z01812 Encounter for preprocedural laboratory examination: Secondary | ICD-10-CM | POA: Diagnosis not present

## 2020-01-02 DIAGNOSIS — Z20822 Contact with and (suspected) exposure to covid-19: Secondary | ICD-10-CM | POA: Insufficient documentation

## 2020-01-02 LAB — SARS CORONAVIRUS 2 (TAT 6-24 HRS): SARS Coronavirus 2: NEGATIVE

## 2020-01-02 NOTE — Anesthesia Preprocedure Evaluation (Addendum)
Anesthesia Evaluation  Patient identified by MRN, date of birth, ID band Patient awake    Reviewed: Allergy & Precautions, NPO status , Patient's Chart, lab work & pertinent test results  History of Anesthesia Complications Negative for: history of anesthetic complications  Airway Mallampati: III  TM Distance: >3 FB Neck ROM: Full    Dental  (+) Upper Dentures, Partial Lower   Pulmonary former smoker,    breath sounds clear to auscultation       Cardiovascular hypertension, (-) angina+ CAD, + Cardiac Stents and + Peripheral Vascular Disease  (-) DOE  Rhythm:Regular Rate:Normal   HLD   Neuro/Psych    GI/Hepatic GERD  Controlled,  Endo/Other  diabetes  Renal/GU CRFRenal disease     Musculoskeletal   Abdominal (+) + obese (BMI 32),   Peds  Hematology  (+) anemia ,  MGUS H/o chronic DVT   Anesthesia Other Findings   Reproductive/Obstetrics                            Anesthesia Physical Anesthesia Plan  ASA: III  Anesthesia Plan: MAC   Post-op Pain Management:    Induction: Intravenous  PONV Risk Score and Plan: 1 and TIVA, Midazolam and Treatment may vary due to age or medical condition  Airway Management Planned: Nasal Cannula  Additional Equipment:   Intra-op Plan:   Post-operative Plan:   Informed Consent: I have reviewed the patients History and Physical, chart, labs and discussed the procedure including the risks, benefits and alternatives for the proposed anesthesia with the patient or authorized representative who has indicated his/her understanding and acceptance.       Plan Discussed with: CRNA and Anesthesiologist  Anesthesia Plan Comments:         Anesthesia Quick Evaluation

## 2020-01-05 ENCOUNTER — Other Ambulatory Visit: Payer: Self-pay

## 2020-01-05 ENCOUNTER — Ambulatory Visit
Admission: RE | Admit: 2020-01-05 | Discharge: 2020-01-05 | Disposition: A | Discharge status: Home / Self Care | Payer: Medicare HMO | Attending: Ophthalmology | Admitting: Ophthalmology

## 2020-01-05 ENCOUNTER — Ambulatory Visit: Payer: Medicare HMO | Admitting: Anesthesiology

## 2020-01-05 ENCOUNTER — Encounter: Payer: Self-pay | Admitting: Ophthalmology

## 2020-01-05 ENCOUNTER — Encounter
Admission: RE | Disposition: A | Discharge status: Home / Self Care | Payer: Self-pay | Source: Home / Self Care | Attending: Ophthalmology

## 2020-01-05 DIAGNOSIS — E7800 Pure hypercholesterolemia, unspecified: Secondary | ICD-10-CM | POA: Diagnosis not present

## 2020-01-05 DIAGNOSIS — Z7982 Long term (current) use of aspirin: Secondary | ICD-10-CM | POA: Diagnosis not present

## 2020-01-05 DIAGNOSIS — Z7901 Long term (current) use of anticoagulants: Secondary | ICD-10-CM | POA: Diagnosis not present

## 2020-01-05 DIAGNOSIS — Z79899 Other long term (current) drug therapy: Secondary | ICD-10-CM | POA: Diagnosis not present

## 2020-01-05 DIAGNOSIS — Z7984 Long term (current) use of oral hypoglycemic drugs: Secondary | ICD-10-CM | POA: Diagnosis not present

## 2020-01-05 DIAGNOSIS — K219 Gastro-esophageal reflux disease without esophagitis: Secondary | ICD-10-CM | POA: Insufficient documentation

## 2020-01-05 DIAGNOSIS — H25812 Combined forms of age-related cataract, left eye: Secondary | ICD-10-CM | POA: Diagnosis not present

## 2020-01-05 DIAGNOSIS — Z955 Presence of coronary angioplasty implant and graft: Secondary | ICD-10-CM | POA: Insufficient documentation

## 2020-01-05 DIAGNOSIS — Z87891 Personal history of nicotine dependence: Secondary | ICD-10-CM | POA: Diagnosis not present

## 2020-01-05 DIAGNOSIS — E785 Hyperlipidemia, unspecified: Secondary | ICD-10-CM | POA: Diagnosis not present

## 2020-01-05 DIAGNOSIS — E1151 Type 2 diabetes mellitus with diabetic peripheral angiopathy without gangrene: Secondary | ICD-10-CM | POA: Diagnosis not present

## 2020-01-05 DIAGNOSIS — H2512 Age-related nuclear cataract, left eye: Secondary | ICD-10-CM | POA: Diagnosis not present

## 2020-01-05 DIAGNOSIS — I2510 Atherosclerotic heart disease of native coronary artery without angina pectoris: Secondary | ICD-10-CM | POA: Insufficient documentation

## 2020-01-05 DIAGNOSIS — E1136 Type 2 diabetes mellitus with diabetic cataract: Secondary | ICD-10-CM | POA: Diagnosis not present

## 2020-01-05 DIAGNOSIS — E1140 Type 2 diabetes mellitus with diabetic neuropathy, unspecified: Secondary | ICD-10-CM | POA: Insufficient documentation

## 2020-01-05 DIAGNOSIS — I10 Essential (primary) hypertension: Secondary | ICD-10-CM | POA: Diagnosis not present

## 2020-01-05 DIAGNOSIS — H25042 Posterior subcapsular polar age-related cataract, left eye: Secondary | ICD-10-CM | POA: Diagnosis not present

## 2020-01-05 DIAGNOSIS — N400 Enlarged prostate without lower urinary tract symptoms: Secondary | ICD-10-CM | POA: Diagnosis not present

## 2020-01-05 HISTORY — DX: Presence of dental prosthetic device (complete) (partial): Z97.2

## 2020-01-05 HISTORY — PX: CATARACT EXTRACTION W/PHACO: SHX586

## 2020-01-05 LAB — GLUCOSE, CAPILLARY
Glucose-Capillary: 150 mg/dL — ABNORMAL HIGH (ref 70–99)
Glucose-Capillary: 152 mg/dL — ABNORMAL HIGH (ref 70–99)

## 2020-01-05 SURGERY — PHACOEMULSIFICATION, CATARACT, WITH IOL INSERTION
Anesthesia: Monitor Anesthesia Care | Site: Eye | Laterality: Left

## 2020-01-05 MED ORDER — LACTATED RINGERS IV SOLN: 100.0000 mL/h | INTRAVENOUS | Status: DC

## 2020-01-05 MED ORDER — FENTANYL CITRATE (PF) 100 MCG/2ML IJ SOLN
INTRAMUSCULAR | Status: DC | PRN
  Administered 2020-01-05: 50 ug via INTRAVENOUS

## 2020-01-05 MED ORDER — SODIUM HYALURONATE 10 MG/ML IO SOLN
INTRAOCULAR | Status: DC | PRN
  Administered 2020-01-05: 0.55 mL via INTRAOCULAR

## 2020-01-05 MED ORDER — LIDOCAINE HCL (PF) 2 % IJ SOLN
INTRAOCULAR | Status: DC | PRN
  Administered 2020-01-05: 1 mL via INTRAOCULAR

## 2020-01-05 MED ORDER — ARMC OPHTHALMIC DILATING DROPS
1.0000 "application " | OPHTHALMIC | Status: DC | PRN
  Administered 2020-01-05 (×3): 1 via OPHTHALMIC

## 2020-01-05 MED ORDER — TETRACAINE HCL 0.5 % OP SOLN
1.0000 [drp] | OPHTHALMIC | Status: DC | PRN
  Administered 2020-01-05 (×3): 1 [drp] via OPHTHALMIC

## 2020-01-05 MED ORDER — MOXIFLOXACIN HCL 0.5 % OP SOLN
OPHTHALMIC | Status: DC | PRN
  Administered 2020-01-05: 0.2 mL via OPHTHALMIC

## 2020-01-05 MED ORDER — MIDAZOLAM HCL 2 MG/2ML IJ SOLN
INTRAMUSCULAR | Status: DC | PRN
  Administered 2020-01-05: 1 mg via INTRAVENOUS

## 2020-01-05 MED ORDER — SODIUM HYALURONATE 23 MG/ML IO SOLN
INTRAOCULAR | Status: DC | PRN
  Administered 2020-01-05: 0.6 mL via INTRAOCULAR

## 2020-01-05 MED ORDER — EPINEPHRINE PF 1 MG/ML IJ SOLN
INTRAOCULAR | Status: DC | PRN
  Administered 2020-01-05: 114 mL via OPHTHALMIC

## 2020-01-05 SURGICAL SUPPLY — 20 items
CANNULA ANT/CHMB 27G (MISCELLANEOUS) ×2 IMPLANT
CANNULA ANT/CHMB 27GA (MISCELLANEOUS) ×6 IMPLANT
DISSECTOR HYDRO NUCLEUS 50X22 (MISCELLANEOUS) ×3 IMPLANT
GLOVE SURG LX 7.5 STRW (GLOVE) ×2
GLOVE SURG LX STRL 7.5 STRW (GLOVE) ×1 IMPLANT
GLOVE SURG SYN 8.5  E (GLOVE) ×2
GLOVE SURG SYN 8.5 E (GLOVE) ×1 IMPLANT
GLOVE SURG SYN 8.5 PF PI (GLOVE) ×1 IMPLANT
GOWN STRL REUS W/ TWL LRG LVL3 (GOWN DISPOSABLE) ×2 IMPLANT
GOWN STRL REUS W/TWL LRG LVL3 (GOWN DISPOSABLE) ×4
LENS IOL DIOP 22.5 (Intraocular Lens) ×3 IMPLANT
LENS IOL TECNIS MONO 22.5 (Intraocular Lens) IMPLANT
MARKER SKIN DUAL TIP RULER LAB (MISCELLANEOUS) ×3 IMPLANT
PACK DR. KING ARMS (PACKS) ×3 IMPLANT
PACK EYE AFTER SURG (MISCELLANEOUS) ×3 IMPLANT
PACK OPTHALMIC (MISCELLANEOUS) ×3 IMPLANT
SYR 3ML LL SCALE MARK (SYRINGE) ×3 IMPLANT
SYR TB 1ML LUER SLIP (SYRINGE) ×3 IMPLANT
WATER STERILE IRR 250ML POUR (IV SOLUTION) ×3 IMPLANT
WIPE NON LINTING 3.25X3.25 (MISCELLANEOUS) ×3 IMPLANT

## 2020-01-05 NOTE — Anesthesia Postprocedure Evaluation (Signed)
Anesthesia Post Note  Patient: International aid/development worker  Procedure(s) Performed: CATARACT EXTRACTION PHACO AND INTRAOCULAR LENS PLACEMENT (IOC) LEFT  3.62  00:36.4 (Left Eye)     Patient location during evaluation: PACU Anesthesia Type: MAC Level of consciousness: awake and alert Pain management: pain level controlled Vital Signs Assessment: post-procedure vital signs reviewed and stable Respiratory status: spontaneous breathing, nonlabored ventilation, respiratory function stable and patient connected to nasal cannula oxygen Cardiovascular status: stable and blood pressure returned to baseline Postop Assessment: no apparent nausea or vomiting Anesthetic complications: no    [name]  Kohl Polinsky

## 2020-01-05 NOTE — H&P (Signed)

## 2020-01-05 NOTE — Op Note (Signed)
OPERATIVE NOTE  Victor Dixon [id number] 01/05/2020   PREOPERATIVE DIAGNOSIS:  Nuclear sclerotic cataract left eye.  H25.12   POSTOPERATIVE DIAGNOSIS:    Nuclear sclerotic cataract left eye.     PROCEDURE:  Phacoemusification with posterior chamber intraocular lens placement of the left eye   LENS:   Implant Name Type Inv. Item Serial No. Manufacturer Lot No. LRB No. Used Action  LENS IOL DIOP 22.5 - [id number] Intraocular Lens LENS IOL DIOP 22.5 [id number]  Left 1 Implanted      Procedure(s) with comments: CATARACT EXTRACTION PHACO AND INTRAOCULAR LENS PLACEMENT (IOC) LEFT  3.62  00:36.4 (Left) - Diabetic - oral meds  DCB00 +22.5   ULTRASOUND TIME: 0 minutes 36 seconds.  CDE 3.62   SURGEON:  Benay Pillow, MD, MPH   ANESTHESIA:  Topical with tetracaine drops augmented with 1% preservative-free intracameral lidocaine.  ESTIMATED BLOOD LOSS: <1 mL   COMPLICATIONS:  None.   DESCRIPTION OF PROCEDURE:  The patient was identified in the holding room and transported to the operating room and placed in the supine position under the operating microscope.  The left eye was identified as the operative eye and it was prepped and draped in the usual sterile ophthalmic fashion.   A 1.0 millimeter clear-corneal paracentesis was made at the 5:00 position. 0.5 ml of preservative-free 1% lidocaine with epinephrine was injected into the anterior chamber.  The anterior chamber was filled with Healon 5 viscoelastic.  A 2.4 millimeter keratome was used to make a near-clear corneal incision at the 2:00 position.  A curvilinear capsulorrhexis was made with a cystotome and capsulorrhexis forceps.  Balanced salt solution was used to hydrodissect and hydrodelineate the nucleus.   Phacoemulsification was then used in stop and chop fashion to remove the lens nucleus and epinucleus.  The remaining cortex was then removed using the irrigation and aspiration handpiece. Healon was then placed into the  capsular bag to distend it for lens placement.  A lens was then injected into the capsular bag.  The remaining viscoelastic was aspirated.   Wounds were hydrated with balanced salt solution.  The anterior chamber was inflated to a physiologic pressure with balanced salt solution.  Intracameral vigamox 0.1 mL undiltued was injected into the eye and a drop placed onto the ocular surface.  No wound leaks were noted.  The patient was taken to the recovery room in stable condition without complications of anesthesia or surgery  Benay Pillow 01/05/2020, 8:35 AM

## 2020-01-05 NOTE — Anesthesia Procedure Notes (Signed)
Procedure Name: MAC Date/Time: 01/05/2020 8:05 AM Performed by: Cameron Ali, CRNA Pre-anesthesia Checklist: Patient identified, Emergency Drugs available, Suction available, Timeout performed and Patient being monitored Patient Re-evaluated:Patient Re-evaluated prior to induction Oxygen Delivery Method: Nasal cannula Placement Confirmation: positive ETCO2

## 2020-01-05 NOTE — Transfer of Care (Signed)
Immediate Anesthesia Transfer of Care Note  Patient: International aid/development worker  Procedure(s) Performed: CATARACT EXTRACTION PHACO AND INTRAOCULAR LENS PLACEMENT (IOC) LEFT  3.62  00:36.4 (Left Eye)  Patient Location: PACU  Anesthesia Type: MAC  Level of Consciousness: awake, alert  and patient cooperative  Airway and Oxygen Therapy: Patient Spontanous Breathing and Patient connected to supplemental oxygen  Post-op Assessment: Post-op Vital signs reviewed, Patient's Cardiovascular Status Stable, Respiratory Function Stable, Patent Airway and No signs of Nausea or vomiting  Post-op Vital Signs: Reviewed and stable  Complications: No apparent anesthesia complications

## 2020-01-06 ENCOUNTER — Encounter: Payer: Self-pay | Admitting: *Deleted

## 2020-01-16 DIAGNOSIS — H2511 Age-related nuclear cataract, right eye: Secondary | ICD-10-CM | POA: Diagnosis not present

## 2020-01-16 DIAGNOSIS — K219 Gastro-esophageal reflux disease without esophagitis: Secondary | ICD-10-CM | POA: Diagnosis not present

## 2020-01-27 ENCOUNTER — Other Ambulatory Visit: Payer: Self-pay

## 2020-01-27 ENCOUNTER — Encounter: Payer: Self-pay | Admitting: Ophthalmology

## 2020-01-29 ENCOUNTER — Other Ambulatory Visit
Admission: RE | Admit: 2020-01-29 | Discharge: 2020-01-29 | Disposition: A | Discharge status: Home / Self Care | Payer: Medicare HMO | Source: Ambulatory Visit | Attending: Ophthalmology | Admitting: Ophthalmology

## 2020-01-29 DIAGNOSIS — Z20822 Contact with and (suspected) exposure to covid-19: Secondary | ICD-10-CM | POA: Diagnosis not present

## 2020-01-29 DIAGNOSIS — Z01812 Encounter for preprocedural laboratory examination: Secondary | ICD-10-CM | POA: Diagnosis not present

## 2020-01-29 LAB — SARS CORONAVIRUS 2 (TAT 6-24 HRS): SARS Coronavirus 2: NEGATIVE

## 2020-01-29 NOTE — Discharge Instructions (Signed)

## 2020-02-02 ENCOUNTER — Ambulatory Visit: Payer: Medicare HMO | Admitting: Anesthesiology

## 2020-02-02 ENCOUNTER — Encounter
Admission: RE | Disposition: A | Discharge status: Home / Self Care | Payer: Self-pay | Source: Home / Self Care | Attending: Ophthalmology

## 2020-02-02 ENCOUNTER — Other Ambulatory Visit: Payer: Self-pay

## 2020-02-02 ENCOUNTER — Encounter: Payer: Self-pay | Admitting: Ophthalmology

## 2020-02-02 ENCOUNTER — Ambulatory Visit
Admission: RE | Admit: 2020-02-02 | Discharge: 2020-02-02 | Disposition: A | Discharge status: Home / Self Care | Payer: Medicare HMO | Attending: Ophthalmology | Admitting: Ophthalmology

## 2020-02-02 DIAGNOSIS — Z86718 Personal history of other venous thrombosis and embolism: Secondary | ICD-10-CM | POA: Insufficient documentation

## 2020-02-02 DIAGNOSIS — E7800 Pure hypercholesterolemia, unspecified: Secondary | ICD-10-CM | POA: Diagnosis not present

## 2020-02-02 DIAGNOSIS — E1151 Type 2 diabetes mellitus with diabetic peripheral angiopathy without gangrene: Secondary | ICD-10-CM | POA: Insufficient documentation

## 2020-02-02 DIAGNOSIS — Z87891 Personal history of nicotine dependence: Secondary | ICD-10-CM | POA: Insufficient documentation

## 2020-02-02 DIAGNOSIS — I10 Essential (primary) hypertension: Secondary | ICD-10-CM | POA: Insufficient documentation

## 2020-02-02 DIAGNOSIS — H2511 Age-related nuclear cataract, right eye: Secondary | ICD-10-CM | POA: Diagnosis not present

## 2020-02-02 DIAGNOSIS — K219 Gastro-esophageal reflux disease without esophagitis: Secondary | ICD-10-CM | POA: Insufficient documentation

## 2020-02-02 DIAGNOSIS — Z9842 Cataract extraction status, left eye: Secondary | ICD-10-CM | POA: Insufficient documentation

## 2020-02-02 DIAGNOSIS — E1140 Type 2 diabetes mellitus with diabetic neuropathy, unspecified: Secondary | ICD-10-CM | POA: Diagnosis not present

## 2020-02-02 DIAGNOSIS — E1136 Type 2 diabetes mellitus with diabetic cataract: Secondary | ICD-10-CM | POA: Diagnosis not present

## 2020-02-02 DIAGNOSIS — D649 Anemia, unspecified: Secondary | ICD-10-CM | POA: Diagnosis not present

## 2020-02-02 DIAGNOSIS — H25811 Combined forms of age-related cataract, right eye: Secondary | ICD-10-CM | POA: Diagnosis not present

## 2020-02-02 HISTORY — PX: CATARACT EXTRACTION W/PHACO: SHX586

## 2020-02-02 LAB — GLUCOSE, CAPILLARY
Glucose-Capillary: 116 mg/dL — ABNORMAL HIGH (ref 70–99)
Glucose-Capillary: 132 mg/dL — ABNORMAL HIGH (ref 70–99)

## 2020-02-02 SURGERY — PHACOEMULSIFICATION, CATARACT, WITH IOL INSERTION
Anesthesia: Monitor Anesthesia Care | Site: Eye | Laterality: Right

## 2020-02-02 MED ORDER — SODIUM HYALURONATE 23 MG/ML IO SOLN
INTRAOCULAR | Status: DC | PRN
  Administered 2020-02-02: 0.6 mL via INTRAOCULAR

## 2020-02-02 MED ORDER — SODIUM HYALURONATE 10 MG/ML IO SOLN
INTRAOCULAR | Status: DC | PRN
  Administered 2020-02-02: 0.55 mL via INTRAOCULAR

## 2020-02-02 MED ORDER — ACETAMINOPHEN 10 MG/ML IV SOLN: 1000.0000 mg | Freq: Once | INTRAVENOUS | Status: DC | PRN

## 2020-02-02 MED ORDER — ONDANSETRON HCL 4 MG/2ML IJ SOLN: 4.0000 mg | Freq: Once | INTRAMUSCULAR | Status: DC | PRN

## 2020-02-02 MED ORDER — FENTANYL CITRATE (PF) 100 MCG/2ML IJ SOLN
INTRAMUSCULAR | Status: DC | PRN
  Administered 2020-02-02: 50 ug via INTRAVENOUS

## 2020-02-02 MED ORDER — TETRACAINE HCL 0.5 % OP SOLN
1.0000 [drp] | OPHTHALMIC | Status: DC | PRN
  Administered 2020-02-02 (×3): 1 [drp] via OPHTHALMIC

## 2020-02-02 MED ORDER — EPINEPHRINE PF 1 MG/ML IJ SOLN
INTRAOCULAR | Status: DC | PRN
  Administered 2020-02-02: 107 mL via OPHTHALMIC

## 2020-02-02 MED ORDER — MIDAZOLAM HCL 2 MG/2ML IJ SOLN
INTRAMUSCULAR | Status: DC | PRN
  Administered 2020-02-02: 1 mg via INTRAVENOUS

## 2020-02-02 MED ORDER — MOXIFLOXACIN HCL 0.5 % OP SOLN
OPHTHALMIC | Status: DC | PRN
  Administered 2020-02-02: 0.2 mL via OPHTHALMIC

## 2020-02-02 MED ORDER — LIDOCAINE HCL (PF) 2 % IJ SOLN
INTRAOCULAR | Status: DC | PRN
  Administered 2020-02-02: 1 mL via INTRAOCULAR

## 2020-02-02 MED ORDER — ACETAMINOPHEN 325 MG PO TABS: 325.0000 mg | ORAL_TABLET | ORAL | Status: DC | PRN

## 2020-02-02 MED ORDER — ARMC OPHTHALMIC DILATING DROPS
1.0000 "application " | OPHTHALMIC | Status: DC | PRN
  Administered 2020-02-02 (×3): 1 via OPHTHALMIC

## 2020-02-02 MED ORDER — ACETAMINOPHEN 160 MG/5ML PO SOLN: 325.0000 mg | ORAL | Status: DC | PRN

## 2020-02-02 SURGICAL SUPPLY — 17 items
CANNULA ANT/CHMB 27GA (MISCELLANEOUS) ×6 IMPLANT
DISSECTOR HYDRO NUCLEUS 50X22 (MISCELLANEOUS) ×3 IMPLANT
GLOVE SURG LX 7.5 STRW (GLOVE) ×2
GLOVE SURG LX STRL 7.5 STRW (GLOVE) ×1 IMPLANT
GLOVE SURG SYN 8.5  E (GLOVE) ×3
GLOVE SURG SYN 8.5 E (GLOVE) ×1 IMPLANT
GOWN STRL REUS W/ TWL LRG LVL3 (GOWN DISPOSABLE) ×2 IMPLANT
GOWN STRL REUS W/TWL LRG LVL3 (GOWN DISPOSABLE) ×6
LENS IOL TECNIS ITEC 23.5 (Intraocular Lens) ×3 IMPLANT
MARKER SKIN DUAL TIP RULER LAB (MISCELLANEOUS) ×3 IMPLANT
PACK DR. KING ARMS (PACKS) ×3 IMPLANT
PACK EYE AFTER SURG (MISCELLANEOUS) ×3 IMPLANT
PACK OPTHALMIC (MISCELLANEOUS) ×3 IMPLANT
SYR 3ML LL SCALE MARK (SYRINGE) ×3 IMPLANT
SYR TB 1ML LUER SLIP (SYRINGE) ×3 IMPLANT
WATER STERILE IRR 250ML POUR (IV SOLUTION) ×3 IMPLANT
WIPE NON LINTING 3.25X3.25 (MISCELLANEOUS) ×3 IMPLANT

## 2020-02-02 NOTE — Op Note (Signed)
OPERATIVE NOTE  Victor Dixon [id number] 02/02/2020   PREOPERATIVE DIAGNOSIS:  Nuclear sclerotic cataract right eye.  H25.11   POSTOPERATIVE DIAGNOSIS:    Nuclear sclerotic cataract right eye.     PROCEDURE:  Phacoemusification with posterior chamber intraocular lens placement of the right eye   LENS:   Implant Name Type Inv. Item Serial No. Manufacturer Lot No. LRB No. Used Action  LENS IOL DIOP 23.5 - [id number] Intraocular Lens LENS IOL DIOP 23.5 [id number]  Right 1 Implanted       Procedure(s) with comments: CATARACT EXTRACTION PHACO AND INTRAOCULAR LENS PLACEMENT (IOC) RIGHT DIABETIC 3.73  00:49.1 (Right) - Diabetic - oral meds  PCB00 +23.5   ULTRASOUND TIME: 0 minutes 49 seconds.  CDE 3.73   SURGEON:  Benay Pillow, MD, MPH  ANESTHESIOLOGIST: Anesthesiologist: Elgie Collard, MD CRNA: Georga Bora, CRNA   ANESTHESIA:  Topical with tetracaine drops augmented with 1% preservative-free intracameral lidocaine.  ESTIMATED BLOOD LOSS: less than 1 mL.   COMPLICATIONS:  None.   DESCRIPTION OF PROCEDURE:  The patient was identified in the holding room and transported to the operating room and placed in the supine position under the operating microscope.  The right eye was identified as the operative eye and it was prepped and draped in the usual sterile ophthalmic fashion.   A 1.0 millimeter clear-corneal paracentesis was made at the 10:30 position. 0.5 ml of preservative-free 1% lidocaine with epinephrine was injected into the anterior chamber.  The anterior chamber was filled with Healon 5 viscoelastic.  A 2.4 millimeter keratome was used to make a near-clear corneal incision at the 8:00 position.  A curvilinear capsulorrhexis was made with a cystotome and capsulorrhexis forceps.  Balanced salt solution was used to hydrodissect and hydrodelineate the nucleus.   Phacoemulsification was then used in stop and chop fashion to remove the lens nucleus and epinucleus.  The  remaining cortex was then removed using the irrigation and aspiration handpiece. Healon was then placed into the capsular bag to distend it for lens placement.  A lens was then injected into the capsular bag.  The remaining viscoelastic was aspirated.  There was heavy arcus and the cornea was mildly cloudy.  There was likely some small amount of residual subincisional cortex left behind, but visibility was too poor to safely pursue removal of that.   Wounds were hydrated with balanced salt solution.  The anterior chamber was inflated to a physiologic pressure with balanced salt solution.   Intracameral vigamox 0.1 mL undiluted was injected into the eye and a drop placed onto the ocular surface.  No wound leaks were noted.  The patient was taken to the recovery room in stable condition without complications of anesthesia or surgery  Benay Pillow 02/02/2020, 8:01 AM

## 2020-02-02 NOTE — Anesthesia Postprocedure Evaluation (Signed)
Anesthesia Post Note  Patient: International aid/development worker  Procedure(s) Performed: CATARACT EXTRACTION PHACO AND INTRAOCULAR LENS PLACEMENT (IOC) RIGHT DIABETIC 3.73  00:49.1 (Right Eye)     Patient location during evaluation: PACU Anesthesia Type: MAC Level of consciousness: awake and alert Pain management: pain level controlled Vital Signs Assessment: post-procedure vital signs reviewed and stable Respiratory status: spontaneous breathing Cardiovascular status: stable Anesthetic complications: no    Reylynn Vanalstine, III,  Malcolm Quast D

## 2020-02-02 NOTE — Anesthesia Procedure Notes (Signed)
Procedure Name: MAC Date/Time: 02/02/2020 7:38 AM Performed by: Georga Bora, CRNA Pre-anesthesia Checklist: Patient identified, Emergency Drugs available, Suction available, Patient being monitored and Timeout performed Patient Re-evaluated:Patient Re-evaluated prior to induction Oxygen Delivery Method: Nasal cannula

## 2020-02-02 NOTE — H&P (Signed)

## 2020-02-02 NOTE — Anesthesia Preprocedure Evaluation (Addendum)
Anesthesia Evaluation  Patient identified by MRN, date of birth, ID band Patient awake    Reviewed: Allergy & Precautions, H&P , NPO status , Patient's Chart, lab work & pertinent test results  History of Anesthesia Complications Negative for: history of anesthetic complications  Airway Mallampati: I  TM Distance: >3 FB Neck ROM: full    Dental  (+) Edentulous Upper   Pulmonary former smoker,    Pulmonary exam normal breath sounds clear to auscultation       Cardiovascular hypertension, On Medications Normal cardiovascular exam Rhythm:regular Rate:Normal     Neuro/Psych negative neurological ROS  negative psych ROS   GI/Hepatic Medicated,  Endo/Other  diabetes, Well Controlled, Type 2  Renal/GU   negative genitourinary   Musculoskeletal   Abdominal   Peds  Hematology  (+) Blood dyscrasia, anemia ,   Anesthesia Other Findings   Reproductive/Obstetrics                             Anesthesia Physical Anesthesia Plan  ASA: II  Anesthesia Plan: MAC   Post-op Pain Management:    Induction:   PONV Risk Score and Plan:   Airway Management Planned:   Additional Equipment:   Intra-op Plan:   Post-operative Plan:   Informed Consent: I have reviewed the patients History and Physical, chart, labs and discussed the procedure including the risks, benefits and alternatives for the proposed anesthesia with the patient or authorized representative who has indicated his/her understanding and acceptance.       Plan Discussed with:   Anesthesia Plan Comments:        Anesthesia Quick Evaluation

## 2020-02-02 NOTE — Transfer of Care (Signed)
Immediate Anesthesia Transfer of Care Note  Patient: International aid/development worker  Procedure(s) Performed: CATARACT EXTRACTION PHACO AND INTRAOCULAR LENS PLACEMENT ([facility]) RIGHT DIABETIC 3.73  00:49.1 (Right Eye)  Patient Location: PACU  Anesthesia Type: MAC  Level of Consciousness: awake, alert  and patient cooperative  Airway and Oxygen Therapy: Patient Spontanous Breathing and Patient connected to supplemental oxygen  Post-op Assessment: Post-op Vital signs reviewed, Patient's Cardiovascular Status Stable, Respiratory Function Stable, Patent Airway and No signs of Nausea or vomiting  Post-op Vital Signs: Reviewed and stable  Complications: No apparent anesthesia complications

## 2020-02-03 ENCOUNTER — Encounter: Payer: Self-pay | Admitting: *Deleted

## 2020-05-20 DIAGNOSIS — D6869 Other thrombophilia: Secondary | ICD-10-CM | POA: Diagnosis not present

## 2020-05-20 DIAGNOSIS — D472 Monoclonal gammopathy: Secondary | ICD-10-CM | POA: Diagnosis not present

## 2020-05-20 DIAGNOSIS — I10 Essential (primary) hypertension: Secondary | ICD-10-CM | POA: Diagnosis not present

## 2020-05-20 DIAGNOSIS — E538 Deficiency of other specified B group vitamins: Secondary | ICD-10-CM | POA: Diagnosis not present

## 2020-05-20 DIAGNOSIS — E1169 Type 2 diabetes mellitus with other specified complication: Secondary | ICD-10-CM | POA: Diagnosis not present

## 2020-05-20 DIAGNOSIS — Z0000 Encounter for general adult medical examination without abnormal findings: Secondary | ICD-10-CM | POA: Diagnosis not present

## 2020-05-20 DIAGNOSIS — K219 Gastro-esophageal reflux disease without esophagitis: Secondary | ICD-10-CM | POA: Diagnosis not present

## 2020-05-20 DIAGNOSIS — E1142 Type 2 diabetes mellitus with diabetic polyneuropathy: Secondary | ICD-10-CM | POA: Diagnosis not present

## 2020-05-20 DIAGNOSIS — E1151 Type 2 diabetes mellitus with diabetic peripheral angiopathy without gangrene: Secondary | ICD-10-CM | POA: Diagnosis not present

## 2020-05-20 DIAGNOSIS — E785 Hyperlipidemia, unspecified: Secondary | ICD-10-CM | POA: Diagnosis not present

## 2020-05-20 DIAGNOSIS — Z79899 Other long term (current) drug therapy: Secondary | ICD-10-CM | POA: Diagnosis not present

## 2020-05-20 DIAGNOSIS — M545 Low back pain: Secondary | ICD-10-CM | POA: Diagnosis not present

## 2020-05-20 DIAGNOSIS — M353 Polymyalgia rheumatica: Secondary | ICD-10-CM | POA: Diagnosis not present

## 2020-05-20 DIAGNOSIS — G8929 Other chronic pain: Secondary | ICD-10-CM | POA: Diagnosis not present

## 2020-05-20 DIAGNOSIS — E1140 Type 2 diabetes mellitus with diabetic neuropathy, unspecified: Secondary | ICD-10-CM | POA: Diagnosis not present

## 2020-06-03 DIAGNOSIS — E785 Hyperlipidemia, unspecified: Secondary | ICD-10-CM | POA: Diagnosis not present

## 2020-06-03 DIAGNOSIS — E1151 Type 2 diabetes mellitus with diabetic peripheral angiopathy without gangrene: Secondary | ICD-10-CM | POA: Diagnosis not present

## 2020-06-03 DIAGNOSIS — R0789 Other chest pain: Secondary | ICD-10-CM | POA: Diagnosis not present

## 2020-06-03 DIAGNOSIS — K219 Gastro-esophageal reflux disease without esophagitis: Secondary | ICD-10-CM | POA: Diagnosis not present

## 2020-06-03 DIAGNOSIS — E1159 Type 2 diabetes mellitus with other circulatory complications: Secondary | ICD-10-CM | POA: Diagnosis not present

## 2020-06-03 DIAGNOSIS — I739 Peripheral vascular disease, unspecified: Secondary | ICD-10-CM | POA: Diagnosis not present

## 2020-06-03 DIAGNOSIS — I152 Hypertension secondary to endocrine disorders: Secondary | ICD-10-CM | POA: Diagnosis not present

## 2020-06-03 DIAGNOSIS — E1169 Type 2 diabetes mellitus with other specified complication: Secondary | ICD-10-CM | POA: Diagnosis not present

## 2020-07-20 DIAGNOSIS — R0789 Other chest pain: Secondary | ICD-10-CM | POA: Diagnosis not present

## 2020-07-23 DIAGNOSIS — E1159 Type 2 diabetes mellitus with other circulatory complications: Secondary | ICD-10-CM | POA: Diagnosis not present

## 2020-07-23 DIAGNOSIS — I152 Hypertension secondary to endocrine disorders: Secondary | ICD-10-CM | POA: Diagnosis not present

## 2020-07-23 DIAGNOSIS — Z86718 Personal history of other venous thrombosis and embolism: Secondary | ICD-10-CM | POA: Diagnosis not present

## 2020-07-23 DIAGNOSIS — E1140 Type 2 diabetes mellitus with diabetic neuropathy, unspecified: Secondary | ICD-10-CM | POA: Diagnosis not present

## 2020-07-23 DIAGNOSIS — E1169 Type 2 diabetes mellitus with other specified complication: Secondary | ICD-10-CM | POA: Diagnosis not present

## 2020-07-23 DIAGNOSIS — I739 Peripheral vascular disease, unspecified: Secondary | ICD-10-CM | POA: Diagnosis not present

## 2020-07-23 DIAGNOSIS — E785 Hyperlipidemia, unspecified: Secondary | ICD-10-CM | POA: Diagnosis not present

## 2020-07-23 DIAGNOSIS — E1151 Type 2 diabetes mellitus with diabetic peripheral angiopathy without gangrene: Secondary | ICD-10-CM | POA: Diagnosis not present

## 2020-10-13 DIAGNOSIS — Z23 Encounter for immunization: Secondary | ICD-10-CM | POA: Diagnosis not present

## 2020-11-22 DIAGNOSIS — Z79899 Other long term (current) drug therapy: Secondary | ICD-10-CM | POA: Diagnosis not present

## 2020-11-22 DIAGNOSIS — M353 Polymyalgia rheumatica: Secondary | ICD-10-CM | POA: Diagnosis not present

## 2020-11-22 DIAGNOSIS — N401 Enlarged prostate with lower urinary tract symptoms: Secondary | ICD-10-CM | POA: Diagnosis not present

## 2020-11-22 DIAGNOSIS — E1169 Type 2 diabetes mellitus with other specified complication: Secondary | ICD-10-CM | POA: Diagnosis not present

## 2020-11-22 DIAGNOSIS — I10 Essential (primary) hypertension: Secondary | ICD-10-CM | POA: Diagnosis not present

## 2020-11-22 DIAGNOSIS — E1140 Type 2 diabetes mellitus with diabetic neuropathy, unspecified: Secondary | ICD-10-CM | POA: Diagnosis not present

## 2020-11-22 DIAGNOSIS — R351 Nocturia: Secondary | ICD-10-CM | POA: Diagnosis not present

## 2020-11-22 DIAGNOSIS — D6869 Other thrombophilia: Secondary | ICD-10-CM | POA: Diagnosis not present

## 2020-11-22 DIAGNOSIS — E1151 Type 2 diabetes mellitus with diabetic peripheral angiopathy without gangrene: Secondary | ICD-10-CM | POA: Diagnosis not present

## 2020-11-22 DIAGNOSIS — E1142 Type 2 diabetes mellitus with diabetic polyneuropathy: Secondary | ICD-10-CM | POA: Diagnosis not present

## 2020-11-22 DIAGNOSIS — E785 Hyperlipidemia, unspecified: Secondary | ICD-10-CM | POA: Diagnosis not present

## 2020-11-22 DIAGNOSIS — E538 Deficiency of other specified B group vitamins: Secondary | ICD-10-CM | POA: Diagnosis not present

## 2021-01-06 DIAGNOSIS — N289 Disorder of kidney and ureter, unspecified: Secondary | ICD-10-CM | POA: Diagnosis not present

## 2021-01-18 DIAGNOSIS — Z86718 Personal history of other venous thrombosis and embolism: Secondary | ICD-10-CM | POA: Diagnosis not present

## 2021-01-18 DIAGNOSIS — Z6831 Body mass index (BMI) 31.0-31.9, adult: Secondary | ICD-10-CM | POA: Diagnosis not present

## 2021-01-18 DIAGNOSIS — R0602 Shortness of breath: Secondary | ICD-10-CM | POA: Diagnosis not present

## 2021-01-18 DIAGNOSIS — I739 Peripheral vascular disease, unspecified: Secondary | ICD-10-CM | POA: Diagnosis not present

## 2021-01-18 DIAGNOSIS — E1151 Type 2 diabetes mellitus with diabetic peripheral angiopathy without gangrene: Secondary | ICD-10-CM | POA: Diagnosis not present

## 2021-03-01 DIAGNOSIS — E119 Type 2 diabetes mellitus without complications: Secondary | ICD-10-CM | POA: Diagnosis not present

## 2021-07-01 DIAGNOSIS — H43811 Vitreous degeneration, right eye: Secondary | ICD-10-CM | POA: Diagnosis not present

## 2021-08-02 DIAGNOSIS — Z6831 Body mass index (BMI) 31.0-31.9, adult: Secondary | ICD-10-CM | POA: Diagnosis not present

## 2021-08-02 DIAGNOSIS — Z86718 Personal history of other venous thrombosis and embolism: Secondary | ICD-10-CM | POA: Diagnosis not present

## 2021-08-02 DIAGNOSIS — E1159 Type 2 diabetes mellitus with other circulatory complications: Secondary | ICD-10-CM | POA: Diagnosis not present

## 2021-08-02 DIAGNOSIS — E785 Hyperlipidemia, unspecified: Secondary | ICD-10-CM | POA: Diagnosis not present

## 2021-08-02 DIAGNOSIS — I739 Peripheral vascular disease, unspecified: Secondary | ICD-10-CM | POA: Diagnosis not present

## 2021-08-02 DIAGNOSIS — E1169 Type 2 diabetes mellitus with other specified complication: Secondary | ICD-10-CM | POA: Diagnosis not present

## 2021-08-02 DIAGNOSIS — R1013 Epigastric pain: Secondary | ICD-10-CM | POA: Diagnosis not present

## 2021-08-02 DIAGNOSIS — R0602 Shortness of breath: Secondary | ICD-10-CM | POA: Diagnosis not present

## 2021-08-02 DIAGNOSIS — E1151 Type 2 diabetes mellitus with diabetic peripheral angiopathy without gangrene: Secondary | ICD-10-CM | POA: Diagnosis not present

## 2022-01-02 DIAGNOSIS — E1142 Type 2 diabetes mellitus with diabetic polyneuropathy: Secondary | ICD-10-CM | POA: Diagnosis not present

## 2022-01-02 DIAGNOSIS — Z125 Encounter for screening for malignant neoplasm of prostate: Secondary | ICD-10-CM | POA: Diagnosis not present

## 2022-01-02 DIAGNOSIS — K219 Gastro-esophageal reflux disease without esophagitis: Secondary | ICD-10-CM | POA: Diagnosis not present

## 2022-01-02 DIAGNOSIS — D472 Monoclonal gammopathy: Secondary | ICD-10-CM | POA: Diagnosis not present

## 2022-01-02 DIAGNOSIS — M47816 Spondylosis without myelopathy or radiculopathy, lumbar region: Secondary | ICD-10-CM | POA: Diagnosis not present

## 2022-01-02 DIAGNOSIS — G8929 Other chronic pain: Secondary | ICD-10-CM | POA: Diagnosis not present

## 2022-01-02 DIAGNOSIS — D509 Iron deficiency anemia, unspecified: Secondary | ICD-10-CM | POA: Diagnosis not present

## 2022-01-02 DIAGNOSIS — R351 Nocturia: Secondary | ICD-10-CM | POA: Diagnosis not present

## 2022-01-02 DIAGNOSIS — I739 Peripheral vascular disease, unspecified: Secondary | ICD-10-CM | POA: Diagnosis not present

## 2022-01-02 DIAGNOSIS — Z86718 Personal history of other venous thrombosis and embolism: Secondary | ICD-10-CM | POA: Diagnosis not present

## 2022-01-02 DIAGNOSIS — E1140 Type 2 diabetes mellitus with diabetic neuropathy, unspecified: Secondary | ICD-10-CM | POA: Diagnosis not present

## 2022-01-02 DIAGNOSIS — D6869 Other thrombophilia: Secondary | ICD-10-CM | POA: Diagnosis not present

## 2022-01-02 DIAGNOSIS — L84 Corns and callosities: Secondary | ICD-10-CM | POA: Diagnosis not present

## 2022-01-02 DIAGNOSIS — I10 Essential (primary) hypertension: Secondary | ICD-10-CM | POA: Diagnosis not present

## 2022-01-02 DIAGNOSIS — E538 Deficiency of other specified B group vitamins: Secondary | ICD-10-CM | POA: Diagnosis not present

## 2022-01-02 DIAGNOSIS — I152 Hypertension secondary to endocrine disorders: Secondary | ICD-10-CM | POA: Diagnosis not present

## 2022-01-02 DIAGNOSIS — E1159 Type 2 diabetes mellitus with other circulatory complications: Secondary | ICD-10-CM | POA: Diagnosis not present

## 2022-01-02 DIAGNOSIS — E1151 Type 2 diabetes mellitus with diabetic peripheral angiopathy without gangrene: Secondary | ICD-10-CM | POA: Diagnosis not present

## 2022-01-02 DIAGNOSIS — E785 Hyperlipidemia, unspecified: Secondary | ICD-10-CM | POA: Diagnosis not present

## 2022-01-02 DIAGNOSIS — Z6828 Body mass index (BMI) 28.0-28.9, adult: Secondary | ICD-10-CM | POA: Diagnosis not present

## 2022-01-02 DIAGNOSIS — Z0000 Encounter for general adult medical examination without abnormal findings: Secondary | ICD-10-CM | POA: Diagnosis not present

## 2022-01-02 DIAGNOSIS — N401 Enlarged prostate with lower urinary tract symptoms: Secondary | ICD-10-CM | POA: Diagnosis not present

## 2022-01-02 DIAGNOSIS — E1169 Type 2 diabetes mellitus with other specified complication: Secondary | ICD-10-CM | POA: Diagnosis not present

## 2022-01-11 DIAGNOSIS — E1151 Type 2 diabetes mellitus with diabetic peripheral angiopathy without gangrene: Secondary | ICD-10-CM | POA: Diagnosis not present

## 2022-01-11 DIAGNOSIS — B351 Tinea unguium: Secondary | ICD-10-CM | POA: Diagnosis not present

## 2022-01-17 DIAGNOSIS — R634 Abnormal weight loss: Secondary | ICD-10-CM | POA: Diagnosis not present

## 2022-01-17 DIAGNOSIS — Z86010 Personal history of colonic polyps: Secondary | ICD-10-CM | POA: Diagnosis not present

## 2022-01-17 DIAGNOSIS — K219 Gastro-esophageal reflux disease without esophagitis: Secondary | ICD-10-CM | POA: Diagnosis not present

## 2022-01-17 DIAGNOSIS — K5900 Constipation, unspecified: Secondary | ICD-10-CM | POA: Diagnosis not present

## 2022-01-17 DIAGNOSIS — Z1211 Encounter for screening for malignant neoplasm of colon: Secondary | ICD-10-CM | POA: Diagnosis not present

## 2022-01-17 DIAGNOSIS — R140 Abdominal distension (gaseous): Secondary | ICD-10-CM | POA: Diagnosis not present

## 2022-01-17 DIAGNOSIS — Z862 Personal history of diseases of the blood and blood-forming organs and certain disorders involving the immune mechanism: Secondary | ICD-10-CM | POA: Diagnosis not present

## 2022-01-17 DIAGNOSIS — R1084 Generalized abdominal pain: Secondary | ICD-10-CM | POA: Diagnosis not present

## 2022-02-09 DIAGNOSIS — E1151 Type 2 diabetes mellitus with diabetic peripheral angiopathy without gangrene: Secondary | ICD-10-CM | POA: Diagnosis not present

## 2022-04-21 ENCOUNTER — Encounter: Payer: Self-pay | Admitting: Gastroenterology

## 2022-04-23 ENCOUNTER — Encounter: Payer: Self-pay | Admitting: Gastroenterology

## 2022-04-23 NOTE — H&P (Signed)
Pre-Procedure H&P   Patient ID: Victor Dixon is a 81 y.o. male.  Gastroenterology Provider: [name], DO  Referring Provider: Dr. Ubaldo Glassing PCP: Ezequiel Kayser, MD  Date: 04/24/2022  HPI Mr. Victor Dixon is a 81 y.o. male who presents today for Esophagogastroduodenoscopy and Colonoscopy for Abdominal pain and worsening constipation and weight loss. Patient seen in the office with worsening constipation and increasing abdominal discomfort secondary to this.  He does a good 4 to 5 days without a bowel movement and as time goes on increasing abdominal pain.  This is relieved by having a bowel movement.  He denies any melena or hematochezia.  He does note increased bloating without nausea or vomiting.  Since starting a regular bowel movement with regimen, his pain and regularity has greatly improved.  Over the last year he has lost approximately 30 pounds and now down to 215 pounds.  Despite this he reports he has maintained his appetite.  He does take docusate and senna to help increase his bowel frequency.  He denies any family history of colorectal cancer or colon polyps  He does have reflux that he controls with as needed Protonix.  He denies any dysphagia or odynophagia.  Most recent lab work hemoglobin 10.7 which has been stable since 2015.  MCV 88 platelets 233,000 creatinine 0.7 A1c 5.6.  Remote EGD.  Colonoscopy in 2013 with diverticulosis and without polyps.  Colonoscopy in 2008 noted polyps, however, unknown type  Patient quit tobacco in 2005  Currently on Eliquis which has been held for 2 days prior to the procedure   Past Medical History:  Diagnosis Date   Anemia    Diabetes mellitus without complication ([place])    DVT of lower extremity (deep venous thrombosis) ([facility])    GERD (gastroesophageal reflux disease)    Hypertension    Peripheral arterial disease ([place])    Wears dentures    full upper, partial lower    Past Surgical History:  Procedure  Laterality Date   APPENDECTOMY     CAROTID ARTERY ANGIOPLASTY Right    CATARACT EXTRACTION W/PHACO Left 01/05/2020   Procedure: CATARACT EXTRACTION PHACO AND INTRAOCULAR LENS PLACEMENT ([place]) LEFT  3.62  00:36.4;  Surgeon: Eulogio Bear, MD;  Location: [facility];  Service: Ophthalmology;  Laterality: Left;  Diabetic - oral meds   CATARACT EXTRACTION W/PHACO Right 02/02/2020   Procedure: CATARACT EXTRACTION PHACO AND INTRAOCULAR LENS PLACEMENT ([facility]) RIGHT DIABETIC 3.73  00:49.1;  Surgeon: Eulogio Bear, MD;  Location: [facility];  Service: Ophthalmology;  Laterality: Right;  Diabetic - oral meds   endoscopic carpal tunnel release Left    EYE SURGERY     PERIPHERAL VASCULAR CATHETERIZATION Left 08/15/2016   Procedure: Lower Extremity Angiography;  Surgeon: Katha Cabal, MD;  Location: [facility];  Service: Cardiovascular;  Laterality: Left;   PERIPHERAL VASCULAR CATHETERIZATION  08/15/2016   Procedure: Lower Extremity Intervention;  Surgeon: Katha Cabal, MD;  Location: [facility];  Service: Cardiovascular;;    Family History No h/o GI disease or malignancy  Review of Systems  Constitutional:  Positive for unexpected weight change (Lost 30 pounds in 1 year). Negative for activity change, appetite change, chills, diaphoresis, fatigue and fever.  HENT:  Negative for trouble swallowing and voice change.   Respiratory:  Negative for shortness of breath and wheezing.   Cardiovascular:  Negative for chest pain, palpitations and leg swelling.  Gastrointestinal:  Positive for abdominal distention, abdominal pain (  Resolved with BM) and constipation. Negative for anal bleeding, blood in stool, diarrhea, nausea and vomiting.       Positive for bloating and reflux  Musculoskeletal:  Negative for arthralgias and myalgias.  Skin:  Negative for color change and pallor.  Neurological:  Negative for dizziness, syncope and weakness.   Psychiatric/Behavioral:  Negative for confusion. The patient is not nervous/anxious.   All other systems reviewed and are negative.    Medications Current Facility-Administered Medications on File Prior to Encounter  Medication Dose Route Frequency Provider Last Rate Last Admin   heparin lock flush 100 unit/mL  500 Units Intravenous Once Monia Sabal, PA-C       Current Outpatient Medications on File Prior to Encounter  Medication Sig Dispense Refill   acetaminophen (TYLENOL) 325 MG tablet Take 650 mg by mouth every 6 (six) hours as needed.     tamsulosin (FLOMAX) 0.4 MG CAPS capsule Take 0.4 mg by mouth daily. Take 30 minutes after same meal each day     aspirin EC 81 MG tablet Take 81 mg by mouth daily.     carvedilol (COREG) 6.25 MG tablet Take 6.25 mg by mouth 2 (two) times daily with a meal.     cilostazol (PLETAL) 100 MG tablet Take 100 mg by mouth 2 (two) times daily.     Cyanocobalamin (VITAMIN B 12) 250 MCG LOZG Take 500 mcg by mouth every morning.     ELIQUIS 5 MG TABS tablet Take 1 tablet by mouth twice daily 60 tablet 0   finasteride (PROSCAR) 5 MG tablet Take 5 mg by mouth daily.     gabapentin (NEURONTIN) 300 MG capsule Take 300 mg by mouth 3 (three) times daily.     glimepiride (AMARYL) 2 MG tablet Take 1 mg by mouth daily with breakfast.      hydrochlorothiazide (HYDRODIURIL) 25 MG tablet Take 25 mg by mouth daily.     losartan (COZAAR) 100 MG tablet Take 100 mg by mouth daily.     metFORMIN (GLUCOPHAGE-XR) 500 MG 24 hr tablet Take 500 mg by mouth 2 (two) times daily with a meal.     ONETOUCH ULTRA test strip USE 1 STRIP TO CHECK GLUCOSE ONCE DAILY AS DIRECTED     pantoprazole (PROTONIX) 40 MG tablet Take 40 mg by mouth daily.     predniSONE (DELTASONE) 1 MG tablet Take 3 tablets by mouth daily.     senna (SENOKOT) 8.6 MG tablet Take 1 tablet by mouth daily.     simvastatin (ZOCOR) 40 MG tablet Take 40 mg by mouth daily.      Pertinent medications related to GI and  procedure were reviewed by me with the patient prior to the procedure   Current Facility-Administered Medications:    0.9 %  sodium chloride infusion, , Intravenous, Continuous, [name], DO  Facility-Administered Medications Ordered in Other Encounters:    heparin lock flush 100 unit/mL, 500 Units, Intravenous, Once, Monia Sabal, PA-C      No Known Allergies Allergies were reviewed by me prior to the procedure  Objective   Body mass index is 30.48 kg/m. Vitals:   04/24/22 0947  BP: (!) 163/57  Pulse: 79  Resp: 18  Temp: (!) 96.9 F (36.1 C)  TempSrc: Temporal  SpO2: 98%  Weight: 104.8 kg  Height: '6\' 1"'$  (1.854 m)    Physical Exam Vitals and nursing note reviewed.  Constitutional:      General: He is not in acute distress.  Appearance: Normal appearance. He is not ill-appearing, toxic-appearing or diaphoretic.  HENT:     Head: Normocephalic and atraumatic.     Nose: Nose normal.     Mouth/Throat:     Mouth: Mucous membranes are moist.     Pharynx: Oropharynx is clear.  Eyes:     General: No scleral icterus.    Extraocular Movements: Extraocular movements intact.  Cardiovascular:     Rate and Rhythm: Normal rate and regular rhythm.     Heart sounds: Normal heart sounds. No murmur heard.    No friction rub. No gallop.  Pulmonary:     Effort: Pulmonary effort is normal. No respiratory distress.     Breath sounds: Normal breath sounds. No wheezing, rhonchi or rales.  Abdominal:     General: Bowel sounds are normal. There is no distension.     Palpations: Abdomen is soft.     Tenderness: There is no abdominal tenderness. There is no guarding or rebound.  Musculoskeletal:     Cervical back: Neck supple.     Right lower leg: No edema.     Left lower leg: No edema.  Skin:    General: Skin is warm and dry.     Coloration: Skin is not jaundiced or pale.  Neurological:     General: No focal deficit present.     Mental Status: He is alert and  oriented to person, place, and time. Mental status is at baseline.  Psychiatric:        Mood and Affect: Mood normal.        Behavior: Behavior normal.        Thought Content: Thought content normal.        Judgment: Judgment normal.      Assessment:  Mr. Victor Dixon is a 81 y.o. male  who presents today for Esophagogastroduodenoscopy and Colonoscopy for Abdominal pain and worsening constipation and weight loss.  Plan:  Esophagogastroduodenoscopy and Colonoscopy with possible intervention today  Esophagogastroduodenoscopy and Colonoscopy with possible biopsy, control of bleeding, polypectomy, and interventions as necessary has been discussed with the patient/patient representative. Informed consent was obtained from the patient/patient representative after explaining the indication, nature, and risks of the procedure including but not limited to death, bleeding, perforation, missed neoplasm/lesions, cardiorespiratory compromise, and reaction to medications. Opportunity for questions was given and appropriate answers were provided. Patient/patient representative has verbalized understanding is amenable to undergoing the procedure.   [name], DO  [facility]  Portions of the record may have been created with voice recognition software. Occasional wrong-word or 'sound-a-like' substitutions may have occurred due to the inherent limitations of voice recognition software.  Read the chart carefully and recognize, using context, where substitutions may have occurred.

## 2022-04-24 ENCOUNTER — Encounter: Payer: Self-pay | Admitting: Gastroenterology

## 2022-04-24 ENCOUNTER — Ambulatory Visit
Admission: RE | Admit: 2022-04-24 | Discharge: 2022-04-24 | Disposition: A | Discharge status: Home / Self Care | Payer: Medicare HMO | Attending: Gastroenterology | Admitting: Gastroenterology

## 2022-04-24 ENCOUNTER — Encounter
Admission: RE | Disposition: A | Discharge status: Home / Self Care | Payer: Self-pay | Source: Home / Self Care | Attending: Gastroenterology

## 2022-04-24 ENCOUNTER — Ambulatory Visit: Payer: Medicare HMO | Admitting: Anesthesiology

## 2022-04-24 DIAGNOSIS — D124 Benign neoplasm of descending colon: Secondary | ICD-10-CM | POA: Insufficient documentation

## 2022-04-24 DIAGNOSIS — Z87891 Personal history of nicotine dependence: Secondary | ICD-10-CM | POA: Insufficient documentation

## 2022-04-24 DIAGNOSIS — K31811 Angiodysplasia of stomach and duodenum with bleeding: Secondary | ICD-10-CM | POA: Insufficient documentation

## 2022-04-24 DIAGNOSIS — Z86718 Personal history of other venous thrombosis and embolism: Secondary | ICD-10-CM | POA: Insufficient documentation

## 2022-04-24 DIAGNOSIS — K2100 Gastro-esophageal reflux disease with esophagitis, without bleeding: Secondary | ICD-10-CM | POA: Diagnosis not present

## 2022-04-24 DIAGNOSIS — K2970 Gastritis, unspecified, without bleeding: Secondary | ICD-10-CM | POA: Insufficient documentation

## 2022-04-24 DIAGNOSIS — K2289 Other specified disease of esophagus: Secondary | ICD-10-CM | POA: Diagnosis not present

## 2022-04-24 DIAGNOSIS — K640 First degree hemorrhoids: Secondary | ICD-10-CM | POA: Diagnosis not present

## 2022-04-24 DIAGNOSIS — K6389 Other specified diseases of intestine: Secondary | ICD-10-CM | POA: Diagnosis not present

## 2022-04-24 DIAGNOSIS — Z6830 Body mass index (BMI) 30.0-30.9, adult: Secondary | ICD-10-CM | POA: Insufficient documentation

## 2022-04-24 DIAGNOSIS — I10 Essential (primary) hypertension: Secondary | ICD-10-CM | POA: Diagnosis not present

## 2022-04-24 DIAGNOSIS — D122 Benign neoplasm of ascending colon: Secondary | ICD-10-CM | POA: Insufficient documentation

## 2022-04-24 DIAGNOSIS — E119 Type 2 diabetes mellitus without complications: Secondary | ICD-10-CM | POA: Diagnosis not present

## 2022-04-24 DIAGNOSIS — Z7901 Long term (current) use of anticoagulants: Secondary | ICD-10-CM | POA: Diagnosis not present

## 2022-04-24 DIAGNOSIS — R634 Abnormal weight loss: Secondary | ICD-10-CM | POA: Diagnosis not present

## 2022-04-24 DIAGNOSIS — D123 Benign neoplasm of transverse colon: Secondary | ICD-10-CM | POA: Insufficient documentation

## 2022-04-24 DIAGNOSIS — D649 Anemia, unspecified: Secondary | ICD-10-CM | POA: Diagnosis present

## 2022-04-24 HISTORY — PX: ESOPHAGOGASTRODUODENOSCOPY: SHX5428

## 2022-04-24 HISTORY — DX: Acute embolism and thrombosis of unspecified deep veins of unspecified lower extremity: I82.409

## 2022-04-24 HISTORY — PX: COLONOSCOPY: SHX5424

## 2022-04-24 HISTORY — DX: Peripheral vascular disease, unspecified: I73.9

## 2022-04-24 LAB — GLUCOSE, CAPILLARY: Glucose-Capillary: 97 mg/dL (ref 70–99)

## 2022-04-24 SURGERY — COLONOSCOPY
Anesthesia: General

## 2022-04-24 MED ORDER — PHENYLEPHRINE 80 MCG/ML (10ML) SYRINGE FOR IV PUSH (FOR BLOOD PRESSURE SUPPORT)
PREFILLED_SYRINGE | INTRAVENOUS | Status: AC
  Filled 2022-04-24: qty 10

## 2022-04-24 MED ORDER — PHENYLEPHRINE HCL (PRESSORS) 10 MG/ML IV SOLN
INTRAVENOUS | Status: DC | PRN
  Administered 2022-04-24: 80 ug via INTRAVENOUS
  Administered 2022-04-24: 160 ug via INTRAVENOUS
  Administered 2022-04-24: 80 ug via INTRAVENOUS

## 2022-04-24 MED ORDER — DEXMEDETOMIDINE (PRECEDEX) IN NS 20 MCG/5ML (4 MCG/ML) IV SYRINGE
PREFILLED_SYRINGE | INTRAVENOUS | Status: DC | PRN
  Administered 2022-04-24: 4 ug via INTRAVENOUS

## 2022-04-24 MED ORDER — PROPOFOL 500 MG/50ML IV EMUL
INTRAVENOUS | Status: AC
  Filled 2022-04-24: qty 100

## 2022-04-24 MED ORDER — LIDOCAINE HCL (CARDIAC) PF 100 MG/5ML IV SOSY
PREFILLED_SYRINGE | INTRAVENOUS | Status: DC | PRN
  Administered 2022-04-24: 60 mg via INTRAVENOUS

## 2022-04-24 MED ORDER — PROPOFOL 500 MG/50ML IV EMUL
INTRAVENOUS | Status: DC | PRN
  Administered 2022-04-24: 140 ug/kg/min via INTRAVENOUS

## 2022-04-24 MED ORDER — SODIUM CHLORIDE 0.9 % IV SOLN: INTRAVENOUS | Status: DC

## 2022-04-24 MED ORDER — PROPOFOL 10 MG/ML IV BOLUS
INTRAVENOUS | Status: DC | PRN
  Administered 2022-04-24: 70 mg via INTRAVENOUS

## 2022-04-24 MED ORDER — PROPOFOL 500 MG/50ML IV EMUL
INTRAVENOUS | Status: AC
  Filled 2022-04-24: qty 50

## 2022-04-24 NOTE — Anesthesia Postprocedure Evaluation (Signed)
Anesthesia Post Note  Patient: International aid/development worker  Procedure(s) Performed: COLONOSCOPY ESOPHAGOGASTRODUODENOSCOPY (EGD)  Patient location during evaluation: Endoscopy Anesthesia Type: General Level of consciousness: awake and alert Pain management: pain level controlled Vital Signs Assessment: post-procedure vital signs reviewed and stable Respiratory status: spontaneous breathing, nonlabored ventilation, respiratory function stable and patient connected to nasal cannula oxygen Cardiovascular status: blood pressure returned to baseline and stable Postop Assessment: no apparent nausea or vomiting Anesthetic complications: no   No notable events documented.   Last Vitals:  Vitals:   04/24/22 1134 04/24/22 1144  BP: 131/69 (!) 151/68  Pulse: 70 74  Resp: 15 17  Temp:    SpO2: 96% 98%    Last Pain:  Vitals:   04/24/22 1144  TempSrc:   PainSc: 0-No pain                 Precious Haws Naila Elizondo

## 2022-04-24 NOTE — Op Note (Signed)
[facility] Gastroenterology Patient Name: Victor Dixon Procedure Date: 04/24/2022 10:15 AM MRN: [id number] Account #: [id number] Date of Birth: 1941/01/01 Admit Type: Outpatient Age: 81 Room: [facility] ENDO ROOM 2 Gender: Male Note Status: Finalized Instrument Name: Colonscope [id number] Procedure:             Colonoscopy Indications:           Anemia, Weight loss Providers:             Rueben Bash, DO Referring MD:          Christena Flake. Raechel Ache, MD (Referring MD) Medicines:             Monitored Anesthesia Care Complications:         No immediate complications. Estimated blood loss:                         Minimal. Procedure:             Pre-Anesthesia Assessment:                        - Prior to the procedure, a History and Physical was                         performed, and patient medications and allergies were                         reviewed. The patient is competent. The risks and                         benefits of the procedure and the sedation options and                         risks were discussed with the patient. All questions                         were answered and informed consent was obtained.                         Patient identification and proposed procedure were                         verified by the physician, the nurse, the anesthetist                         and the technician in the endoscopy suite. Mental                         Status Examination: alert and oriented. Airway                         Examination: normal oropharyngeal airway and neck                         mobility. Respiratory Examination: clear to                         auscultation. CV Examination: RRR, no murmurs, no S3  or S4. Prophylactic Antibiotics: The patient does not                         require prophylactic antibiotics. Prior                         Anticoagulants: The patient has taken Eliquis                          (apixaban), last dose was 2 days prior to procedure.                         ASA Grade Assessment: III - A patient with severe                         systemic disease. After reviewing the risks and                         benefits, the patient was deemed in satisfactory                         condition to undergo the procedure. The anesthesia                         plan was to use monitored anesthesia care (MAC).                         Immediately prior to administration of medications,                         the patient was re-assessed for adequacy to receive                         sedatives. The heart rate, respiratory rate, oxygen                         saturations, blood pressure, adequacy of pulmonary                         ventilation, and response to care were monitored                         throughout the procedure. The physical status of the                         patient was re-assessed after the procedure.                        After obtaining informed consent, the colonoscope was                         passed under direct vision. Throughout the procedure,                         the patient's blood pressure, pulse, and oxygen                         saturations were monitored continuously. The  Colonoscope was introduced through the anus and                         advanced to the the cecum, identified by appendiceal                         orifice and ileocecal valve. The colonoscopy was                         performed without difficulty. The patient tolerated                         the procedure well. The quality of the bowel                         preparation was evaluated using the BBPS [facility] Bowel                         Preparation Scale) with scores of: Right Colon = 2                         (minor amount of residual staining, small fragments of                         stool and/or opaque liquid, but mucosa seen well),                          Transverse Colon = 3 (entire mucosa seen well with no                         residual staining, small fragments of stool or opaque                         liquid) and Left Colon = 3 (entire mucosa seen well                         with no residual staining, small fragments of stool or                         opaque liquid). The total BBPS score equals 8. The                         quality of the bowel preparation was excellent. The                         ileocecal valve, appendiceal orifice, and rectum were                         photographed. Findings:      The perianal and digital rectal examinations were normal. Pertinent       negatives include normal sphincter tone.      A scattered area of mild melanosis was found in the entire colon.       Estimated blood loss: none.      A 4 to 5 mm polyp was found in the cecum. The polyp was sessile. The       polyp was removed with  a cold snare. Resection was complete, but the       polyp tissue was not retrieved. Estimated blood loss was minimal.      A 1 to 2 mm polyp was found in the ascending colon. The polyp was       sessile. The polyp was removed with a jumbo cold forceps. Resection and       retrieval were complete. Estimated blood loss was minimal.      Two sessile polyps were found in the descending colon and transverse       colon. The polyps were 3 to 5 mm in size. These polyps were removed with       a cold snare. Resection and retrieval were complete. Estimated blood       loss was minimal.      A 1 to 2 mm polyp was found in the transverse colon. The polyp was       sessile. The polyp was removed with a jumbo cold forceps. Resection and       retrieval were complete. Estimated blood loss was minimal.      Non-bleeding internal hemorrhoids were found during retroflexion. The       hemorrhoids were Grade I (internal hemorrhoids that do not prolapse).       Estimated blood loss: none.      The exam was otherwise  without abnormality on direct and retroflexion       views. Impression:            - Melanosis in the colon.                        - One 4 to 5 mm polyp in the cecum, removed with a                         cold snare. Complete resection. Polyp tissue not                         retrieved.                        - One 1 to 2 mm polyp in the ascending colon, removed                         with a jumbo cold forceps. Resected and retrieved.                        - Two 3 to 5 mm polyps in the descending colon and in                         the transverse colon, removed with a cold snare.                         Resected and retrieved.                        - One 1 to 2 mm polyp in the transverse colon, removed                         with a jumbo cold forceps. Resected and retrieved.                        -  Non-bleeding internal hemorrhoids.                        - The examination was otherwise normal on direct and                         retroflexion views. Recommendation:        - Discharge patient to home.                        - Resume previous diet.                        - Continue present medications.                        - Resume Eliquis (apixaban) at prior dose in 2 days.                         Refer to referring physician for further adjustment of                         therapy.                        - Await pathology results.                        - Repeat colonoscopy for surveillance based on                         pathology results.                        - No aspirin, ibuprofen, naproxen, or other                         non-steroidal anti-inflammatory drugs for 5 days after                         polyp removal.                        - Return to GI office as previously scheduled.                        - The findings and recommendations were discussed with                         the patient. Procedure Code(s):     --- Professional ---                         [id number], Colonoscopy, flexible; with removal of                         tumor(s), polyp(s), or other lesion(s) by snare                         technique                        90300, 34, Colonoscopy, flexible; with biopsy,  single                         or multiple Diagnosis Code(s):     --- Professional ---                        K63.89, Other specified diseases of intestine                        K63.5, Polyp of colon                        K64.0, First degree hemorrhoids                        D64.9, Anemia, unspecified                        R63.4, Abnormal weight loss CPT copyright 2019 [employer]. All rights reserved. The codes documented in this report are preliminary and upon coder review may  be revised to meet current compliance requirements. Attending Participation:      I personally performed the entire procedure. Volney American, DO [name] DO, DO 04/24/2022 11:35:35 AM This report has been signed electronically. Number of Addenda: 0 Note Initiated On: 04/24/2022 10:15 AM Scope Withdrawal Time: 0 hours 26 minutes 58 seconds  Total Procedure Duration: 0 hours 42 minutes 48 seconds  Estimated Blood Loss:  Estimated blood loss was minimal.      [facility]

## 2022-04-24 NOTE — Anesthesia Preprocedure Evaluation (Signed)
Anesthesia Evaluation  Patient identified by MRN, date of birth, ID band Patient awake    Reviewed: Allergy & Precautions, NPO status , Patient's Chart, lab work & pertinent test results  History of Anesthesia Complications Negative for: history of anesthetic complications  Airway Mallampati: III  TM Distance: >3 FB Neck ROM: full    Dental  (+) Chipped, Poor Dentition, Missing   Pulmonary neg shortness of breath, former smoker,    Pulmonary exam normal        Cardiovascular Exercise Tolerance: Good hypertension, (-) anginaNormal cardiovascular exam     Neuro/Psych negative neurological ROS  negative psych ROS   GI/Hepatic Neg liver ROS, GERD  Controlled,  Endo/Other  diabetes, Type 2  Renal/GU Renal disease  negative genitourinary   Musculoskeletal   Abdominal   Peds  Hematology negative hematology ROS (+)   Anesthesia Other Findings Past Medical History: No date: Anemia No date: Diabetes mellitus without complication (HCC) No date: DVT of lower extremity (deep venous thrombosis) (HCC) No date: GERD (gastroesophageal reflux disease) No date: Hypertension No date: Peripheral arterial disease (HCC) No date: Wears dentures     Comment:  full upper, partial lower  Past Surgical History: No date: APPENDECTOMY No date: CAROTID ARTERY ANGIOPLASTY; Right 01/05/2020: CATARACT EXTRACTION W/PHACO; Left     Comment:  Procedure: CATARACT EXTRACTION PHACO AND INTRAOCULAR               LENS PLACEMENT (IOC) LEFT  3.62  00:36.4;  Surgeon: Eulogio Bear, MD;  Location: [facility];                Service: Ophthalmology;  Laterality: Left;  Diabetic -               oral meds 02/02/2020: CATARACT EXTRACTION W/PHACO; Right     Comment:  Procedure: CATARACT EXTRACTION PHACO AND INTRAOCULAR               LENS PLACEMENT (IOC) RIGHT DIABETIC 3.73  00:49.1;                Surgeon: Eulogio Bear,  MD;  Location: [facility];  Service: Ophthalmology;  Laterality:               Right;  Diabetic - oral meds No date: endoscopic carpal tunnel release; Left No date: EYE SURGERY 08/15/2016: PERIPHERAL VASCULAR CATHETERIZATION; Left     Comment:  Procedure: Lower Extremity Angiography;  Surgeon:               Katha Cabal, MD;  Location: [facility];                Service: Cardiovascular;  Laterality: Left; 08/15/2016: PERIPHERAL VASCULAR CATHETERIZATION     Comment:  Procedure: Lower Extremity Intervention;  Surgeon:               Katha Cabal, MD;  Location: [facility];                Service: Cardiovascular;;  BMI    Body Mass Index: 30.48 kg/m      Reproductive/Obstetrics negative OB ROS                             Anesthesia Physical Anesthesia  Plan  ASA: 3  Anesthesia Plan: General   Post-op Pain Management:    Induction: Intravenous  PONV Risk Score and Plan: Propofol infusion and TIVA  Airway Management Planned: Natural Airway and Nasal Cannula  Additional Equipment:   Intra-op Plan:   Post-operative Plan:   Informed Consent: I have reviewed the patients History and Physical, chart, labs and discussed the procedure including the risks, benefits and alternatives for the proposed anesthesia with the patient or authorized representative who has indicated his/her understanding and acceptance.     Dental Advisory Given  Plan Discussed with: Anesthesiologist, CRNA and Surgeon  Anesthesia Plan Comments: (Patient consented for risks of anesthesia including but not limited to:  - adverse reactions to medications - risk of airway placement if required - damage to eyes, teeth, lips or other oral mucosa - nerve damage due to positioning  - sore throat or hoarseness - Damage to heart, brain, nerves, lungs, other parts of body or loss of life  Patient voiced understanding.)         Anesthesia Quick Evaluation

## 2022-04-24 NOTE — Interval H&P Note (Signed)
History and Physical Interval Note: Preprocedure H&P from 04/24/22  was reviewed and there was no interval change after seeing and examining the patient.  Written consent was obtained from the patient after discussion of risks, benefits, and alternatives. Patient has consented to proceed with Esophagogastroduodenoscopy and Colonoscopy with possible intervention   04/24/2022 10:08 AM  Victor Dixon  has presented today for surgery, with the diagnosis of Constipation, unspecified constipation type (K59.00) Gastroesophageal reflux disease, unspecified whether esophagitis present (K21.9) Abnormal weight loss (R63.4) History of normocytic normochromic anemia (Z86.2) Personal history of colonic polyps (Z86.010) Abdominal bloating (R14.0) Abdominal pain, generalized (R10.84).  The various methods of treatment have been discussed with the patient and family. After consideration of risks, benefits and other options for treatment, the patient has consented to  Procedure(s): COLONOSCOPY (N/A) ESOPHAGOGASTRODUODENOSCOPY (EGD) (N/A) as a surgical intervention.  The patient's history has been reviewed, patient examined, no change in status, stable for surgery.  I have reviewed the patient's chart and labs.  Questions were answered to the patient's satisfaction.     [name]

## 2022-04-24 NOTE — Op Note (Signed)
[facility] Patient Name: Victor Dixon Procedure Date: 04/24/2022 10:15 AM MRN: [id number] Account #: [id number] Date of Birth: [birth year] Admit Type: Outpatient Age: 81 Room: [facility] ENDO ROOM 2 Gender: Male Note Status: Finalized Instrument Name: Upper Endoscope [id number] Procedure:             Upper GI endoscopy Indications:           Anemia, Weight loss Providers:             Rueben Bash, DO Referring MD:          Christena Flake. Raechel Ache, MD (Referring MD) Medicines:             Monitored Anesthesia Care Complications:         No immediate complications. Estimated blood loss:                         Minimal. Procedure:             Pre-Anesthesia Assessment:                        - Prior to the procedure, a History and Physical was                         performed, and patient medications and allergies were                         reviewed. The patient is competent. The risks and                         benefits of the procedure and the sedation options and                         risks were discussed with the patient. All questions                         were answered and informed consent was obtained.                         Patient identification and proposed procedure were                         verified by the physician, the nurse, the anesthetist                         and the technician in the endoscopy suite. Mental                         Status Examination: alert and oriented. Airway                         Examination: normal oropharyngeal airway and neck                         mobility. Respiratory Examination: clear to                         auscultation. CV Examination: RRR, no murmurs, no S3  or S4. Prophylactic Antibiotics: The patient does not                         require prophylactic antibiotics. Prior                         Anticoagulants: The patient has taken Eliquis                          (apixaban), last dose was 2 days prior to procedure.                         ASA Grade Assessment: III - A patient with severe                         systemic disease. After reviewing the risks and                         benefits, the patient was deemed in satisfactory                         condition to undergo the procedure. The anesthesia                         plan was to use monitored anesthesia care (MAC).                         Immediately prior to administration of medications,                         the patient was re-assessed for adequacy to receive                         sedatives. The heart rate, respiratory rate, oxygen                         saturations, blood pressure, adequacy of pulmonary                         ventilation, and response to care were monitored                         throughout the procedure. The physical status of the                         patient was re-assessed after the procedure.                        After obtaining informed consent, the endoscope was                         passed under direct vision. Throughout the procedure,                         the patient's blood pressure, pulse, and oxygen                         saturations were monitored continuously. The Endoscope  was introduced through the mouth, and advanced to the                         second part of duodenum. The upper GI endoscopy was                         accomplished without difficulty. The patient tolerated                         the procedure well. Findings:      A few diminutive angioectasias with bleeding were found in the first       portion of the duodenum and in the second portion of the duodenum.       Coagulation for hemostasis using argon plasma was successful. Estimated       blood loss: none.      The exam of the duodenum was otherwise normal.      Localized mild inflammation characterized by erythema was found in the        gastric antrum. Biopsies were taken with a cold forceps for Helicobacter       pylori testing. Estimated blood loss was minimal.      The exam of the stomach was otherwise normal.      The Z-line was regular. Estimated blood loss: none.      LA Grade A (one or more mucosal breaks less than 5 mm, not extending       between tops of 2 mucosal folds) esophagitis with no bleeding was found.       Estimated blood loss: none.      Esophagogastric landmarks were identified: the gastroesophageal junction       was found at 40 cm from the incisors.      Scattered mild mucosal changes characterized by nodularity were found in       the entire esophagus. Biopsies were taken with a cold forceps for       histology. Estimated blood loss was minimal.      The exam of the esophagus was otherwise normal. Impression:            - A few bleeding angioectasias in the duodenum.                         Treated with argon plasma coagulation (APC).                        - Gastritis. Biopsied.                        - Z-line regular.                        - LA Grade A reflux esophagitis with no bleeding.                        - Esophagogastric landmarks identified.                        - Nodular mucosa in the esophagus. Biopsied. Recommendation:        - Discharge patient to home.                        -  Resume previous diet.                        - Continue present medications.                        - Resume Eliquis (apixaban) at prior dose in 2 days.                         Refer to referring physician for further adjustment of                         therapy.                        - Return to GI clinic as previously scheduled.                        - The findings and recommendations were discussed with                         the patient.                        - Proceed with colonoscopy Procedure Code(s):     --- Professional ---                        [id number], Esophagogastroduodenoscopy,  flexible,                         transoral; with biopsy, single or multiple Diagnosis Code(s):     --- Professional ---                        K31.811, Angiodysplasia of stomach and duodenum with                         bleeding                        K29.70, Gastritis, unspecified, without bleeding                        K21.00, Gastro-esophageal reflux disease with                         esophagitis, without bleeding                        K22.8, Other specified diseases of esophagus                        D64.9, Anemia, unspecified                        R63.4, Abnormal weight loss CPT copyright 2019 American Medical Association. All rights reserved. The codes documented in this report are preliminary and upon coder review may  be revised to meet current compliance requirements. Attending Participation:      I personally performed the entire procedure. Volney American, DO [name] DO, DO 04/24/2022 11:30:18 AM This report has been signed electronically. Number of Addenda: 0 Note Initiated On: 04/24/2022 10:15 AM  Estimated Blood Loss:  Estimated blood loss was minimal.      [facility]

## 2022-04-24 NOTE — Transfer of Care (Signed)
Immediate Anesthesia Transfer of Care Note  Patient: Victor Dixon  Procedure(s) Performed: COLONOSCOPY ESOPHAGOGASTRODUODENOSCOPY (EGD)  Patient Location: PACU  Anesthesia Type:General  Level of Consciousness: awake, alert  and oriented  Airway & Oxygen Therapy: Patient Spontanous Breathing  Post-op Assessment: Report given to RN and Post -op Vital signs reviewed and stable  Post vital signs: Reviewed and stable  Last Vitals:  Vitals Value Taken Time  BP 92/51 04/24/22 1124  Temp 36 C 04/24/22 1124  Pulse 65 04/24/22 1126  Resp 15 04/24/22 1126  SpO2 95 % 04/24/22 1126  Vitals shown include unvalidated device data.  Last Pain:  Vitals:   04/24/22 1124  TempSrc: Temporal  PainSc: Asleep         Complications: No notable events documented.

## 2022-04-25 ENCOUNTER — Encounter: Payer: Self-pay | Admitting: Gastroenterology

## 2022-04-26 LAB — SURGICAL PATHOLOGY

## 2022-07-18 ENCOUNTER — Emergency Department
Admission: EM | Admit: 2022-07-18 | Discharge: 2022-07-18 | Disposition: A | Discharge status: Home / Self Care | Payer: Medicare HMO | Attending: Emergency Medicine | Admitting: Emergency Medicine

## 2022-07-18 ENCOUNTER — Other Ambulatory Visit: Payer: Self-pay

## 2022-07-18 DIAGNOSIS — I129 Hypertensive chronic kidney disease with stage 1 through stage 4 chronic kidney disease, or unspecified chronic kidney disease: Secondary | ICD-10-CM | POA: Diagnosis not present

## 2022-07-18 DIAGNOSIS — N189 Chronic kidney disease, unspecified: Secondary | ICD-10-CM | POA: Diagnosis not present

## 2022-07-18 DIAGNOSIS — E1122 Type 2 diabetes mellitus with diabetic chronic kidney disease: Secondary | ICD-10-CM | POA: Insufficient documentation

## 2022-07-18 DIAGNOSIS — I83892 Varicose veins of left lower extremities with other complications: Secondary | ICD-10-CM | POA: Diagnosis not present

## 2022-07-18 MED ORDER — SILVER NITRATE-POT NITRATE 75-25 % EX MISC
1.0000 | Freq: Once | CUTANEOUS | Status: AC
  Administered 2022-07-18: 1 via TOPICAL
  Filled 2022-07-18: qty 10

## 2022-07-18 NOTE — ED Provider Notes (Signed)
[facility] Provider Note    Event Date/Time   First MD Initiated Contact with Patient 07/18/22 1227     (approximate)   History   Chief Complaint varicose vein bleeding   HPI Victor Dixon is a 81 y.o. male, history of hypertension, hyperlipidemia, type 2 diabetes, CKD, PAD, chronic DVT, GERD, hypertension, presents to the emergency department for evaluation of varicose vein bleeding.  Patient states that he accidentally hit the top of his left foot on something earlier today, which caused a rupture of on the varicose veins in his feet.  He states that he was having difficulty controlling the bleeding, prompting him to call EMS.  He states that he has had this happen before, which has required repair with surgical glue.  Denies any other injuries or illnesses at this time.  Patient states that he does not believe the broke his foot, and is still able to walk on it okay.  Denies ankle pain, leg pain, numbness/tingling the affected extremity, cold sensation, dizziness/lightheadedness, headache, chest pain, shortness of breath, or abdominal pain.  He is currently on Eliquis and has not missed any doses.  History Limitations: No limitations.        Physical Exam  Triage Vital Signs: ED Triage Vitals  Enc Vitals Group     BP 07/18/22 1156 (!) 138/56     Pulse Rate 07/18/22 1156 78     Resp 07/18/22 1156 18     Temp 07/18/22 1156 98.7 F (37.1 C)     Temp Source 07/18/22 1156 Oral     SpO2 07/18/22 1156 96 %     Weight 07/18/22 1229 231 lb 0.7 oz (104.8 kg)     Height 07/18/22 1229 '6\' 1"'$  (1.854 m)     Head Circumference --      Peak Flow --      Pain Score 07/18/22 1148 0     Pain Loc --      Pain Edu? --      Excl. in [place]? --     Most recent vital signs: Vitals:   07/18/22 1156  BP: (!) 138/56  Pulse: 78  Resp: 18  Temp: 98.7 F (37.1 C)  SpO2: 96%    General: Awake, NAD.  Skin: Warm, dry. No rashes or lesions.  Eyes: PERRL.  Conjunctivae normal.  CV: Good peripheral perfusion.  Resp: Normal effort.  Abd: Soft, non-tender. No distention.  Neuro: At baseline. No gross neurological deficits.  Musculoskeletal: Normal ROM of all extremities.   Focused Exam: Persistent oozing of blood from 1 mm injury to the the mid dorsum of the left foot.  Bleeding despite pressure from standard gauze.  No underlying bony tenderness.  Normal cap refill distally.  Physical Exam    ED Results / Procedures / Treatments  Labs (all labs ordered are listed, but only abnormal results are displayed) Labs Reviewed - No data to display   EKG N/A.   RADIOLOGY  ED Provider Interpretation: N/A    No results found.  PROCEDURES:  Critical Care performed: N/A.  Marland Kitchen.Laceration Repair  Date/Time: 07/18/2022 5:06 PM  Performed by: Teodoro Spray, PA Authorized by: Teodoro Spray, PA   Consent:    Consent obtained:  Verbal   Consent given by:  Patient   Risks, benefits, and alternatives were discussed: yes     Risks discussed:  Infection, pain and retained foreign body Universal protocol:    Patient identity confirmed:  Verbally with patient  Anesthesia:    Anesthesia method:  None Laceration details:    Location:  Foot   Foot location:  Top of L foot   Length (cm):  0.1   Depth (mm):  2 Pre-procedure details:    Preparation:  Patient was prepped and draped in usual sterile fashion Exploration:    Hemostasis achieved with:  Direct pressure   Imaging outcome: foreign body not noted     Wound extent: no areolar tissue violation noted, no fascia violation noted, no foreign bodies/material noted, no nerve damage noted, no tendon damage noted and no underlying fracture noted   Skin repair:    Repair method:  Tissue adhesive Approximation:    Approximation:  Close Repair type:    Repair type:  Simple Post-procedure details:    Dressing:  Sterile dressing and tube gauze   Procedure completion:  Tolerated well,  no immediate complications     MEDICATIONS ORDERED IN ED: Medications  silver nitrate applicators applicator 1 Stick (1 Stick Topical Given by Other 07/18/22 1455)     IMPRESSION / MDM / [place] / ED COURSE  I reviewed the triage vital signs and the nursing notes.                              Differential diagnosis includes, but is not limited to, varicose vein bleeding, laceration, foot injury  Assessment/Plan Patient presents with persistent bleeding along the dorsum of the foot following injury to varicose vein.  He appears clinically well.  Very low suspicion for significant blood loss or anemia.  Bleeding was controlled with hemostatic dressing followed by pressure dressing for 20 to 30 minutes.  Applied Dermabond solution to the wound immediately following removal of the dressing.  Patient tolerated the procedure well with no immediate complications.  Additionally placed an additional gauze dressing over top.  Advised him to keep this dressing on for at least 24 hours and minimize direct pressure on the foot for the time being to prevent rebleeding.  Patient was amenable to this plan.  Will discharge.  Provided the patient with anticipatory guidance, return precautions, and educational material. Encouraged the patient to return to the emergency department at any time if they begin to experience any new or worsening symptoms. Patient expressed understanding and agreed with the plan.   Patient's presentation is most consistent with acute, uncomplicated illness.       FINAL CLINICAL IMPRESSION(S) / ED DIAGNOSES   Final diagnoses:  Bleeding from varicose veins of left lower extremity     Rx / DC Orders   ED Discharge Orders     None        Note:  This document was prepared using Dragon voice recognition software and may include unintentional dictation errors.   Teodoro Spray, [place] 07/18/22 1707    Nance Pear, MD 07/19/22 1950

## 2022-07-18 NOTE — Discharge Instructions (Signed)
-  Please keep the dressing on for least 24 hours.  Do not apply any lotions, ointments, or emollients to the Dermabond solution as this will make it dissolve prematurely.  The Dermabond glue will dissolve on its own in 5 to 7 days.  -Return to the emergency department anytime if you begin to experience any new or worsening symptoms.

## 2022-07-18 NOTE — ED Triage Notes (Signed)
Pt comes via EMs from home with c/o varicose veing bleeding to left foot. Ems reports bleeding controlled. Pt unsure what he might have hit it on. Pt is on eliquis. VSS

## 2023-01-17 ENCOUNTER — Other Ambulatory Visit: Payer: Self-pay | Admitting: Physical Medicine & Rehabilitation

## 2023-01-17 DIAGNOSIS — M5442 Lumbago with sciatica, left side: Secondary | ICD-10-CM

## 2023-01-24 ENCOUNTER — Encounter: Payer: Self-pay | Admitting: Physical Medicine & Rehabilitation

## 2023-02-07 ENCOUNTER — Ambulatory Visit
Admission: RE | Admit: 2023-02-07 | Discharge: 2023-02-07 | Disposition: A | Discharge status: Home / Self Care | Payer: Medicare Other | Source: Ambulatory Visit | Attending: Physical Medicine & Rehabilitation | Admitting: Physical Medicine & Rehabilitation

## 2023-02-07 DIAGNOSIS — M5442 Lumbago with sciatica, left side: Secondary | ICD-10-CM

## 2023-02-17 ENCOUNTER — Other Ambulatory Visit: Payer: Self-pay

## 2023-02-17 ENCOUNTER — Emergency Department
Admission: EM | Admit: 2023-02-17 | Discharge: 2023-02-17 | Disposition: A | Discharge status: Home / Self Care | Payer: Medicare Other | Attending: Emergency Medicine | Admitting: Emergency Medicine

## 2023-02-17 DIAGNOSIS — I83899 Varicose veins of unspecified lower extremities with other complications: Secondary | ICD-10-CM | POA: Insufficient documentation

## 2023-02-17 DIAGNOSIS — Z7901 Long term (current) use of anticoagulants: Secondary | ICD-10-CM | POA: Insufficient documentation

## 2023-02-17 NOTE — ED Notes (Signed)
Kerry Fort, pt's niece and emergency contact, called and notified that patient is ready for discharge. Verbalized understanding and will pick him up in about 20 minutes.

## 2023-02-17 NOTE — Discharge Instructions (Signed)
Please seek medical attention for any high fevers, chest pain, shortness of breath, change in behavior, persistent vomiting, bloody stool or any other new or concerning symptoms.  

## 2023-02-17 NOTE — ED Notes (Signed)
Victor Dixon, [place], had to leave for a few minutes but would like to be updated on plan of care. OK per pt.

## 2023-02-17 NOTE — ED Triage Notes (Signed)
Pt BIB [facility] from home C/O varicose vein on L foot rupturing about an hour PTA. Pt reports he was sitting on edge of bed trying to put his socks on and his foot started bleeding. EMS reports initial BP 78/50, improved to 134/57 after 700 mL IVF. Pt arrives A&O X 4. Pressure dressing to L foot in place, bleeding controlled.

## 2023-02-17 NOTE — ED Provider Notes (Signed)
[facility] Provider Note    Event Date/Time   First MD Initiated Contact with Patient 02/17/23 1506     (approximate)   History   Varicose Vein Rupture   HPI  Victor Dixon is a 82 y.o. male   who presents to the emergency department today because of concerns for bleeding from a varicose vein.  He states he was trying to put some socks on when he noticed the bleeding.  He has had similar bleeding once in the past that required him to come to the emergency department.  He denies any trauma to his foot.  States he is on Eliquis. No shortness of breath or weakness.      Physical Exam   Triage Vital Signs: ED Triage Vitals  Enc Vitals Group     BP 02/17/23 1437 (!) 131/47     Pulse Rate 02/17/23 1437 78     Resp 02/17/23 1437 16     Temp 02/17/23 1437 98.1 F (36.7 C)     Temp Source 02/17/23 1437 Oral     SpO2 02/17/23 1437 98 %     Weight 02/17/23 1434 230 lb (104.3 kg)     Height 02/17/23 1434 6\' 1"  (1.854 m)     Head Circumference --      Peak Flow --      Pain Score 02/17/23 1433 0     Pain Loc --      Pain Edu? --      Excl. in [place]? --     Most recent vital signs: Vitals:   02/17/23 1437  BP: (!) 131/47  Pulse: 78  Resp: 16  Temp: 98.1 F (36.7 C)  SpO2: 98%   General: Awake, alert, oriented. CV:  Good peripheral perfusion. Regular rate. Resp:  Normal effort.  Abd:  No distention.  Other:  Varicose vein to top of left foot, small break in skin overlying the vein, no active bleeding.   ED Results / Procedures / Treatments   Labs (all labs ordered are listed, but only abnormal results are displayed) Labs Reviewed - No data to display   EKG  None   RADIOLOGY None  PROCEDURES:  Critical Care performed: No    MEDICATIONS ORDERED IN ED: Medications - No data to display   IMPRESSION / MDM / ASSESSMENT AND PLAN / ED COURSE  I reviewed the triage vital signs and the nursing notes.                               Differential diagnosis includes, but is not limited to, laceration, varicose vein rupture.  Patient's presentation is most consistent with acute presentation with potential threat to life or bodily function.   Patient presented to the emergency department today because of concerns for spontaneous rupture varicose vein on the top of his left foot.  At the time my exam there is no active bleeding.  I do see a small area of skin disruption overlying the varicose vein.  Did place Dermabond over this area.  There was report that the patient was hypotensive when EMS first arrived however patient is not hypotensive here nor tachycardic.  No shortness of breath or weakness.  At this time I have low concern for significant blood loss.   FINAL CLINICAL IMPRESSION(S) / ED DIAGNOSES   Final diagnoses:  Bleeding from varicose vein       Note:  This  document was prepared using Conservation officer, historic buildings and may include unintentional dictation errors.    Phineas Semen, MD 02/17/23 1520

## 2023-02-17 NOTE — ED Notes (Signed)
Called nephew, Donn Pierini, to notify that patient is ready to be picked up. No answer. Pt moved to lobby to wait for his ride. First nurse aware.

## 2023-02-17 NOTE — ED Notes (Signed)
AVS provided to and discussed with patient. Pt verbalizes understanding of discharge instructions and denies any questions or concerns at this time. Pt has ride home. Pt taken out of department via W/C.  

## 2023-02-17 NOTE — ED Notes (Signed)
Dr. Goodman at bedside assessing pt at this time.  ?

## 2023-04-18 ENCOUNTER — Other Ambulatory Visit (INDEPENDENT_AMBULATORY_CARE_PROVIDER_SITE_OTHER): Payer: Self-pay | Admitting: Vascular Surgery

## 2023-04-18 DIAGNOSIS — I739 Peripheral vascular disease, unspecified: Secondary | ICD-10-CM

## 2023-04-23 ENCOUNTER — Ambulatory Visit (INDEPENDENT_AMBULATORY_CARE_PROVIDER_SITE_OTHER): Payer: Medicare Other

## 2023-04-23 ENCOUNTER — Encounter (INDEPENDENT_AMBULATORY_CARE_PROVIDER_SITE_OTHER): Payer: Self-pay | Admitting: Vascular Surgery

## 2023-04-23 ENCOUNTER — Ambulatory Visit (INDEPENDENT_AMBULATORY_CARE_PROVIDER_SITE_OTHER): Payer: Medicare Other | Admitting: Vascular Surgery

## 2023-04-23 VITALS — BP 157/68 | HR 75 | Resp 16 | Wt 202.0 lb

## 2023-04-23 DIAGNOSIS — I83892 Varicose veins of left lower extremities with other complications: Secondary | ICD-10-CM

## 2023-04-23 DIAGNOSIS — I70213 Atherosclerosis of native arteries of extremities with intermittent claudication, bilateral legs: Secondary | ICD-10-CM | POA: Diagnosis not present

## 2023-04-23 DIAGNOSIS — E782 Mixed hyperlipidemia: Secondary | ICD-10-CM

## 2023-04-23 DIAGNOSIS — E1140 Type 2 diabetes mellitus with diabetic neuropathy, unspecified: Secondary | ICD-10-CM | POA: Diagnosis not present

## 2023-04-23 DIAGNOSIS — I10 Essential (primary) hypertension: Secondary | ICD-10-CM | POA: Diagnosis not present

## 2023-04-23 DIAGNOSIS — I739 Peripheral vascular disease, unspecified: Secondary | ICD-10-CM | POA: Diagnosis not present

## 2023-04-23 NOTE — Progress Notes (Signed)
MRN : [id number]  Gianlucas Govin is a 82 y.o. (1941/09/06) male who presents with chief complaint of check circulation.  History of Present Illness: ***  Current Meds  Medication Sig   acetaminophen (TYLENOL) 325 MG tablet Take 650 mg by mouth every 6 (six) hours as needed.   aspirin EC 81 MG tablet Take 81 mg by mouth daily.   carvedilol (COREG) 6.25 MG tablet Take 6.25 mg by mouth 2 (two) times daily with a meal.   cilostazol (PLETAL) 100 MG tablet Take 100 mg by mouth 2 (two) times daily.   Cyanocobalamin (VITAMIN B 12) 250 MCG LOZG Take 500 mcg by mouth every morning.   ELIQUIS 5 MG TABS tablet Take 1 tablet by mouth twice daily   finasteride (PROSCAR) 5 MG tablet Take 5 mg by mouth daily.   gabapentin (NEURONTIN) 300 MG capsule Take 300 mg by mouth 3 (three) times daily.   glimepiride (AMARYL) 2 MG tablet Take 1 mg by mouth daily with breakfast.    hydrochlorothiazide (HYDRODIURIL) 25 MG tablet Take 25 mg by mouth daily.   losartan (COZAAR) 100 MG tablet Take 100 mg by mouth daily.   metFORMIN (GLUCOPHAGE-XR) 500 MG 24 hr tablet Take 500 mg by mouth 2 (two) times daily with a meal.   ONETOUCH ULTRA test strip USE 1 STRIP TO CHECK GLUCOSE ONCE DAILY AS DIRECTED   pantoprazole (PROTONIX) 40 MG tablet Take 40 mg by mouth daily.   predniSONE (DELTASONE) 1 MG tablet Take 3 tablets by mouth daily.   senna (SENOKOT) 8.6 MG tablet Take 1 tablet by mouth daily.   simvastatin (ZOCOR) 40 MG tablet Take 40 mg by mouth daily.   tamsulosin (FLOMAX) 0.4 MG CAPS capsule Take 0.4 mg by mouth daily. Take 30 minutes after same meal each day    Past Medical History:  Diagnosis Date   Anemia    Diabetes mellitus without complication (HCC)    DVT of lower extremity (deep venous thrombosis) (HCC)    GERD (gastroesophageal reflux disease)    Hypertension    Peripheral arterial disease (HCC)    Wears dentures    full upper,  partial lower    Past Surgical History:  Procedure Laterality Date   APPENDECTOMY     CAROTID ARTERY ANGIOPLASTY Right    CATARACT EXTRACTION W/PHACO Left 01/05/2020   Procedure: CATARACT EXTRACTION PHACO AND INTRAOCULAR LENS PLACEMENT (IOC) LEFT  3.62  00:36.4;  Surgeon: Nevada Crane, MD;  Location: [facility];  Service: Ophthalmology;  Laterality: Left;  Diabetic - oral meds   CATARACT EXTRACTION W/PHACO Right 02/02/2020   Procedure: CATARACT EXTRACTION PHACO AND INTRAOCULAR LENS PLACEMENT (IOC) RIGHT DIABETIC 3.73  00:49.1;  Surgeon: Nevada Crane, MD;  Location: [facility];  Service: Ophthalmology;  Laterality: Right;  Diabetic - oral meds   COLONOSCOPY N/A 04/24/2022   Procedure: COLONOSCOPY;  Surgeon: Jaynie Collins, DO;  Location: [facility] ENDOSCOPY;  Service: Gastroenterology;  Laterality: N/A;   endoscopic carpal tunnel release Left    ESOPHAGOGASTRODUODENOSCOPY N/A 04/24/2022   Procedure: ESOPHAGOGASTRODUODENOSCOPY (EGD);  Surgeon: Elfredia Nevins  Casimiro Needle, DO;  Location: [facility] ENDOSCOPY;  Service: Gastroenterology;  Laterality: N/A;   EYE SURGERY     PERIPHERAL VASCULAR CATHETERIZATION Left 08/15/2016   Procedure: Lower Extremity Angiography;  Surgeon: Renford Dills, MD;  Location: [facility] INVASIVE CV LAB;  Service: Cardiovascular;  Laterality: Left;   PERIPHERAL VASCULAR CATHETERIZATION  08/15/2016   Procedure: Lower Extremity Intervention;  Surgeon: Renford Dills, MD;  Location: [facility] INVASIVE CV LAB;  Service: Cardiovascular;;    Social History Social History   Tobacco Use   Smoking status: Former    Packs/day: 0.50    Years: 30.00    Additional pack years: 0.00    Total pack years: 15.00    Types: Cigarettes    Quit date: 02/22/2007    Years since quitting: 16.1   Smokeless tobacco: Never  Vaping Use   Vaping Use: Never used  Substance Use Topics   Alcohol use: No   Drug use: No    Family History Family History  Problem Relation  Age of Onset   Cancer Sister     No Known Allergies   REVIEW OF SYSTEMS (Negative unless checked)  Constitutional: [] Weight loss  [] Fever  [] Chills Cardiac: [] Chest pain   [] Chest pressure   [] Palpitations   [] Shortness of breath when laying flat   [] Shortness of breath with exertion. Vascular:  [x] Pain in legs with walking   [] Pain in legs at rest  [] History of DVT   [] Phlebitis   [] Swelling in legs   [] Varicose veins   [] Non-healing ulcers Pulmonary:   [] Uses home oxygen   [] Productive cough   [] Hemoptysis   [] Wheeze  [] COPD   [] Asthma Neurologic:  [] Dizziness   [] Seizures   [] History of stroke   [] History of TIA  [] Aphasia   [] Vissual changes   [] Weakness or numbness in arm   [] Weakness or numbness in leg Musculoskeletal:   [] Joint swelling   [] Joint pain   [] Low back pain Hematologic:  [] Easy bruising  [] Easy bleeding   [] Hypercoagulable state   [] Anemic Gastrointestinal:  [] Diarrhea   [] Vomiting  [] Gastroesophageal reflux/heartburn   [] Difficulty swallowing. Genitourinary:  [] Chronic kidney disease   [] Difficult urination  [] Frequent urination   [] Blood in urine Skin:  [] Rashes   [] Ulcers  Psychological:  [] History of anxiety   []  History of major depression.  Physical Examination  Vitals:   04/23/23 1339  BP: (!) 157/68  Pulse: 75  Resp: 16  Weight: 202 lb (91.6 kg)   Body mass index is 26.65 kg/m. Gen: WD/WN, NAD Head: [place]/AT, No temporalis wasting.  Ear/Nose/Throat: Hearing grossly intact, nares w/o erythema or drainage Eyes: PER, EOMI, sclera nonicteric.  Neck: Supple, no masses.  No bruit or JVD.  Pulmonary:  Good air movement, no audible wheezing, no use of accessory muscles.  Cardiac: RRR, normal S1, S2, no Murmurs. Vascular:  mild trophic changes, no open wounds Vessel Right Left  Radial Palpable Palpable  PT Not Palpable Not Palpable  DP Not Palpable Not Palpable  Gastrointestinal: soft, non-distended. No guarding/no peritoneal signs.  Musculoskeletal: M/S  5/5 throughout.  No visible deformity.  Neurologic: CN 2-12 intact. Pain and light touch intact in extremities.  Symmetrical.  Speech is fluent. Motor exam as listed above. Psychiatric: Judgment intact, Mood & affect appropriate for pt's clinical situation. Dermatologic: No rashes or ulcers noted.  No changes consistent with cellulitis.   CBC Lab Results  Component Value Date   WBC 6.4 06/24/2019   HGB 9.5 (L) 06/24/2019   HCT 31.1 (  L) 06/24/2019   MCV 86.9 06/24/2019   PLT 253 06/24/2019    BMET    Component Value Date/Time   NA 132 (L) 06/24/2019 0803   NA 139 08/23/2013 0231   K 4.8 06/24/2019 0803   K 3.7 08/23/2013 0231   CL 99 06/24/2019 0803   CL 104 08/23/2013 0231   CO2 24 06/24/2019 0803   CO2 31 08/23/2013 0231   GLUCOSE 166 (H) 06/24/2019 0803   GLUCOSE 133 (H) 08/23/2013 0231   BUN 19 06/24/2019 0803   BUN 9 08/23/2013 0231   CREATININE 1.06 06/24/2019 0803   CREATININE 0.79 08/23/2013 0231   CALCIUM 9.4 06/24/2019 0803   CALCIUM 8.5 08/23/2013 0231   GFRNONAA >60 06/24/2019 0803   GFRNONAA >60 08/23/2013 0231   GFRAA >60 06/24/2019 0803   GFRAA >60 08/23/2013 0231   CrCl cannot be calculated (Patient's most recent lab result is older than the maximum 21 days allowed.).  COAG Lab Results  Component Value Date   INR 0.96 03/14/2017   INR 1.1 08/23/2013    Radiology No results found.   Assessment/Plan There are no diagnoses linked to this encounter.   Levora Dredge, MD  04/23/2023 1:44 PM

## 2023-04-24 DIAGNOSIS — I83892 Varicose veins of left lower extremities with other complications: Secondary | ICD-10-CM | POA: Insufficient documentation

## 2023-04-26 LAB — VAS US ABI WITH/WO TBI: Right ABI: 0.68

## 2023-06-14 ENCOUNTER — Telehealth (INDEPENDENT_AMBULATORY_CARE_PROVIDER_SITE_OTHER): Payer: Self-pay

## 2023-06-14 NOTE — Telephone Encounter (Signed)
Tried to call pt to schedule saline sclero appts both phone lines say they are not receiving calls at this time

## 2023-06-26 ENCOUNTER — Telehealth (INDEPENDENT_AMBULATORY_CARE_PROVIDER_SITE_OTHER): Payer: Self-pay

## 2023-06-26 NOTE — Telephone Encounter (Signed)
I attempted to call pt with no answer than I called his niece Chip Boer and left VM for her to call back to get him scheduled for saline sclero.

## 2023-07-10 DIAGNOSIS — R051 Acute cough: Secondary | ICD-10-CM | POA: Diagnosis not present

## 2023-07-10 DIAGNOSIS — J3489 Other specified disorders of nose and nasal sinuses: Secondary | ICD-10-CM | POA: Diagnosis not present

## 2023-07-10 DIAGNOSIS — E1151 Type 2 diabetes mellitus with diabetic peripheral angiopathy without gangrene: Secondary | ICD-10-CM | POA: Diagnosis not present

## 2023-07-10 DIAGNOSIS — E1169 Type 2 diabetes mellitus with other specified complication: Secondary | ICD-10-CM | POA: Diagnosis not present

## 2023-07-10 DIAGNOSIS — I83892 Varicose veins of left lower extremities with other complications: Secondary | ICD-10-CM | POA: Diagnosis not present

## 2023-07-10 DIAGNOSIS — I152 Hypertension secondary to endocrine disorders: Secondary | ICD-10-CM | POA: Diagnosis not present

## 2023-07-10 DIAGNOSIS — Z09 Encounter for follow-up examination after completed treatment for conditions other than malignant neoplasm: Secondary | ICD-10-CM | POA: Diagnosis not present

## 2023-07-10 DIAGNOSIS — Z86718 Personal history of other venous thrombosis and embolism: Secondary | ICD-10-CM | POA: Diagnosis not present

## 2023-07-10 DIAGNOSIS — E1140 Type 2 diabetes mellitus with diabetic neuropathy, unspecified: Secondary | ICD-10-CM | POA: Diagnosis not present

## 2023-07-10 DIAGNOSIS — E1159 Type 2 diabetes mellitus with other circulatory complications: Secondary | ICD-10-CM | POA: Diagnosis not present

## 2023-07-20 DIAGNOSIS — M79675 Pain in left toe(s): Secondary | ICD-10-CM | POA: Diagnosis not present

## 2023-07-20 DIAGNOSIS — B351 Tinea unguium: Secondary | ICD-10-CM | POA: Diagnosis not present

## 2023-07-20 DIAGNOSIS — M79674 Pain in right toe(s): Secondary | ICD-10-CM | POA: Diagnosis not present

## 2023-07-20 DIAGNOSIS — E1151 Type 2 diabetes mellitus with diabetic peripheral angiopathy without gangrene: Secondary | ICD-10-CM | POA: Diagnosis not present

## 2023-10-22 ENCOUNTER — Encounter (INDEPENDENT_AMBULATORY_CARE_PROVIDER_SITE_OTHER): Payer: Medicare Other

## 2023-10-22 ENCOUNTER — Ambulatory Visit (INDEPENDENT_AMBULATORY_CARE_PROVIDER_SITE_OTHER): Payer: Medicare Other | Admitting: Vascular Surgery

## 2023-10-22 NOTE — Progress Notes (Unsigned)
MRN : [id number]  Victor Dixon is a 82 y.o. (24-Oct-1941) male who presents with chief complaint of check circulation.  History of Present Illness:   The patient is seen for evaluation of painful lower extremities and diminished pulses. Patient notes the pain is always associated with activity and is very consistent day today. Typically, the pain occurs at less than one block, progress is as activity continues to the point that the patient must stop walking. Resting including standing still for several minutes allows the patient to walk a similar distance before being forced to stop again. Uneven terrain and inclines shorten the distance. The pain has been progressive over the past several years. The patient denies any abrupt changes in claudication symptoms.  The patient states the inability to walk is causing problems with daily activities.   As an additional problem the patient notes that he has had 2 episodes of hemorrhage from a cluster of varicose veins on the dorsum of his left foot.  Most recent episode was severe enough that he actually passed out.  The squad brought him to the emergency room.  He was treated with some Dermabond to stop the bleeding.   The patient denies rest pain or dangling of an extremity off the side of the bed during the night for relief. No open wounds or sores at this time. No prior interventions or surgeries.   No history of back problems or DJD of the lumbar sacral spine.    The patient's blood pressure has been stable and relatively well controlled. The patient denies amaurosis fugax or recent TIA symptoms. There are no recent neurological changes noted. The patient denies history of DVT, PE or superficial thrombophlebitis.   The patient denies recent episodes of angina or shortness of breath.    ABI's Rt=0.68 (monophasic) and Lt=[place] TBI=0.32 (monophasic)  No outpatient medications  have been marked as taking for the 10/22/23 encounter (Appointment) with Gilda Crease, Latina Craver, MD.    Past Medical History:  Diagnosis Date   Anemia    Diabetes mellitus without complication (HCC)    DVT of lower extremity (deep venous thrombosis) (HCC)    GERD (gastroesophageal reflux disease)    Hypertension    Peripheral arterial disease (HCC)    Wears dentures    full upper, partial lower    Past Surgical History:  Procedure Laterality Date   APPENDECTOMY     CAROTID ARTERY ANGIOPLASTY Right    CATARACT EXTRACTION W/PHACO Left 01/05/2020   Procedure: CATARACT EXTRACTION PHACO AND INTRAOCULAR LENS PLACEMENT (IOC) LEFT  3.62  00:36.4;  Surgeon: Nevada Crane, MD;  Location: [facility];  Service: Ophthalmology;  Laterality: Left;  Diabetic - oral meds   CATARACT EXTRACTION W/PHACO Right 02/02/2020   Procedure: CATARACT EXTRACTION PHACO AND INTRAOCULAR LENS PLACEMENT (IOC) RIGHT DIABETIC 3.73  00:49.1;  Surgeon: Nevada Crane, MD;  Location: [facility];  Service: Ophthalmology;  Laterality: Right;  Diabetic - oral meds   COLONOSCOPY N/A 04/24/2022   Procedure: COLONOSCOPY;  Surgeon: Jaynie Collins, DO;  Location: [facility] ENDOSCOPY;  Service: Gastroenterology;  Laterality:  N/A;   endoscopic carpal tunnel release Left    ESOPHAGOGASTRODUODENOSCOPY N/A 04/24/2022   Procedure: ESOPHAGOGASTRODUODENOSCOPY (EGD);  Surgeon: Jaynie Collins, DO;  Location: [facility] ENDOSCOPY;  Service: Gastroenterology;  Laterality: N/A;   EYE SURGERY     PERIPHERAL VASCULAR CATHETERIZATION Left 08/15/2016   Procedure: Lower Extremity Angiography;  Surgeon: Renford Dills, MD;  Location: [facility] INVASIVE CV LAB;  Service: Cardiovascular;  Laterality: Left;   PERIPHERAL VASCULAR CATHETERIZATION  08/15/2016   Procedure: Lower Extremity Intervention;  Surgeon: Renford Dills, MD;  Location: [facility] INVASIVE CV LAB;  Service: Cardiovascular;;    Social History Social History    Tobacco Use   Smoking status: Former    Current packs/day: 0.00    Average packs/day: 0.5 packs/day for 30.0 years (15.0 ttl pk-yrs)    Types: Cigarettes    Start date: 02/21/1977    Quit date: 02/22/2007    Years since quitting: 16.6   Smokeless tobacco: Never  Vaping Use   Vaping status: Never Used  Substance Use Topics   Alcohol use: No   Drug use: No    Family History Family History  Problem Relation Age of Onset   Cancer Sister     No Known Allergies   REVIEW OF SYSTEMS (Negative unless checked)  Constitutional: [] Weight loss  [] Fever  [] Chills Cardiac: [] Chest pain   [] Chest pressure   [] Palpitations   [] Shortness of breath when laying flat   [] Shortness of breath with exertion. Vascular:  [x] Pain in legs with walking   [] Pain in legs at rest  [] History of DVT   [] Phlebitis   [] Swelling in legs   [] Varicose veins   [] Non-healing ulcers Pulmonary:   [] Uses home oxygen   [] Productive cough   [] Hemoptysis   [] Wheeze  [] COPD   [] Asthma Neurologic:  [] Dizziness   [] Seizures   [] History of stroke   [] History of TIA  [] Aphasia   [] Vissual changes   [] Weakness or numbness in arm   [] Weakness or numbness in leg Musculoskeletal:   [] Joint swelling   [] Joint pain   [] Low back pain Hematologic:  [] Easy bruising  [] Easy bleeding   [] Hypercoagulable state   [] Anemic Gastrointestinal:  [] Diarrhea   [] Vomiting  [x] Gastroesophageal reflux/heartburn   [] Difficulty swallowing. Genitourinary:  [x] Chronic kidney disease   [] Difficult urination  [] Frequent urination   [] Blood in urine Skin:  [] Rashes   [] Ulcers  Psychological:  [] History of anxiety   []  History of major depression.  Physical Examination  There were no vitals filed for this visit. There is no height or weight on file to calculate BMI. Gen: WD/WN, NAD Head: [place]/AT, No temporalis wasting.  Ear/Nose/Throat: Hearing grossly intact, nares w/o erythema or drainage Eyes: PER, EOMI, sclera nonicteric.  Neck: Supple, no masses.   No bruit or JVD.  Pulmonary:  Good air movement, no audible wheezing, no use of accessory muscles.  Cardiac: RRR, normal S1, S2, no Murmurs. Vascular:  mild trophic changes, no open wounds Vessel Right Left  Radial Palpable Palpable  PT Not Palpable Not Palpable  DP Not Palpable Not Palpable  Gastrointestinal: soft, non-distended. No guarding/no peritoneal signs.  Musculoskeletal: M/S 5/5 throughout.  No visible deformity.  Neurologic: CN 2-12 intact. Pain and light touch intact in extremities.  Symmetrical.  Speech is fluent. Motor exam as listed above. Psychiatric: Judgment intact, Mood & affect appropriate for pt's clinical situation. Dermatologic: No rashes or ulcers noted.  No changes consistent with cellulitis.   CBC Lab Results  Component Value Date  WBC 6.4 06/24/2019   HGB 9.5 (L) 06/24/2019   HCT 31.1 (L) 06/24/2019   MCV 86.9 06/24/2019   PLT 253 06/24/2019    BMET    Component Value Date/Time   NA 132 (L) 06/24/2019 0803   NA 139 08/23/2013 0231   K 4.8 06/24/2019 0803   K 3.7 08/23/2013 0231   CL 99 06/24/2019 0803   CL 104 08/23/2013 0231   CO2 24 06/24/2019 0803   CO2 31 08/23/2013 0231   GLUCOSE 166 (H) 06/24/2019 0803   GLUCOSE 133 (H) 08/23/2013 0231   BUN 19 06/24/2019 0803   BUN 9 08/23/2013 0231   CREATININE 1.06 06/24/2019 0803   CREATININE 0.79 08/23/2013 0231   CALCIUM 9.4 06/24/2019 0803   CALCIUM 8.5 08/23/2013 0231   GFRNONAA >60 06/24/2019 0803   GFRNONAA >60 08/23/2013 0231   GFRAA >60 06/24/2019 0803   GFRAA >60 08/23/2013 0231   CrCl cannot be calculated (Patient's most recent lab result is older than the maximum 21 days allowed.).  COAG Lab Results  Component Value Date   INR 0.96 03/14/2017   INR 1.1 08/23/2013    Radiology No results found.   Assessment/Plan There are no diagnoses linked to this encounter.   Levora Dredge, MD  10/22/2023 12:55 PM

## 2024-06-03 ENCOUNTER — Other Ambulatory Visit: Payer: Self-pay | Admitting: Gerontology

## 2024-06-03 DIAGNOSIS — R634 Abnormal weight loss: Secondary | ICD-10-CM

## 2024-06-03 DIAGNOSIS — R935 Abnormal findings on diagnostic imaging of other abdominal regions, including retroperitoneum: Secondary | ICD-10-CM

## 2024-06-03 DIAGNOSIS — R630 Anorexia: Secondary | ICD-10-CM

## 2024-06-03 DIAGNOSIS — R635 Abnormal weight gain: Secondary | ICD-10-CM

## 2024-06-05 ENCOUNTER — Other Ambulatory Visit: Payer: Self-pay

## 2024-06-05 ENCOUNTER — Encounter: Payer: Self-pay | Admitting: *Deleted

## 2024-06-05 ENCOUNTER — Emergency Department

## 2024-06-05 DIAGNOSIS — N189 Chronic kidney disease, unspecified: Secondary | ICD-10-CM | POA: Diagnosis not present

## 2024-06-05 DIAGNOSIS — K5900 Constipation, unspecified: Secondary | ICD-10-CM | POA: Diagnosis present

## 2024-06-05 DIAGNOSIS — E1122 Type 2 diabetes mellitus with diabetic chronic kidney disease: Secondary | ICD-10-CM | POA: Insufficient documentation

## 2024-06-05 NOTE — ED Triage Notes (Signed)
 Pt to triage via wheelchair.  Pt reports constipation for 4 days.  Pt taking miralax, prune juice, without relief.  Pt reports passing gas.  Pt alert  speech clear.

## 2024-06-06 ENCOUNTER — Emergency Department
Admission: EM | Admit: 2024-06-06 | Discharge: 2024-06-06 | Disposition: A | Discharge status: Home / Self Care | Attending: Emergency Medicine | Admitting: Emergency Medicine

## 2024-06-06 DIAGNOSIS — K5900 Constipation, unspecified: Secondary | ICD-10-CM

## 2024-06-06 NOTE — Discharge Instructions (Signed)
 Please use MiraLAX one half capful every hour until your first bowel movement.  Please do not take any MiraLAX after this for at least 24 hours.  You may use one half capful twice a day of MiraLAX in order to have 1 solid well-formed bowel movement per day.  You may increase or decrease this dosage as needed to obtain this 1 well-formed bowel movement.  Please make sure that you are drinking at least 8 ounces of water every hour during this initial bowel regimen. ?

## 2024-06-06 NOTE — ED Provider Notes (Signed)
 [facility] Provider Note   Event Date/Time   First MD Initiated Contact with Patient 06/06/24 0119     (approximate) History  Constipation  HPI Victor Dixon is a 83 y.o. male with a past medical history of CKD, type 2 diabetes, MGUS, and hyperlipidemia who presents complaining of constipation for the last 4 days.  Patient has been taking MiraLAX, prune juice, without relief.  Patient states he is passing gas but has not passed any stool in the last 4 days.  Patient denies good hydration over this time.  Patient endorses mild generalized abdominal pain.  Patient endorses history of intermittent constipation ROS: Patient currently denies any vision changes, tinnitus, difficulty speaking, facial droop, sore throat, chest pain, shortness of breath, nausea/vomiting/diarrhea, dysuria, or weakness/numbness/paresthesias in any extremity   Physical Exam  Triage Vital Signs: ED Triage Vitals  Encounter Vitals Group     BP 06/05/24 2237 (!) 178/74     Girls Systolic BP Percentile --      Girls Diastolic BP Percentile --      Boys Systolic BP Percentile --      Boys Diastolic BP Percentile --      Pulse Rate 06/05/24 2237 73     Resp 06/05/24 2237 18     Temp 06/05/24 2237 98.1 F (36.7 C)     Temp Source 06/05/24 2237 Oral     SpO2 06/05/24 2237 100 %     Weight 06/05/24 2238 185 lb (83.9 kg)     Height 06/05/24 2238 6' 1 (1.854 m)     Head Circumference --      Peak Flow --      Pain Score 06/05/24 2237 0     Pain Loc --      Pain Education --      Exclude from Growth Chart --    Most recent vital signs: Vitals:   06/05/24 2237  BP: (!) 178/74  Pulse: 73  Resp: 18  Temp: 98.1 F (36.7 C)  SpO2: 100%   General: Awake, oriented x4. CV:  Good peripheral perfusion. Resp:  Normal effort. Abd:  No distention. Other:  Elderly well-developed, well-nourished African-American male resting comfortably in no acute distress ED Results / Procedures /  Treatments  Labs (all labs ordered are listed, but only abnormal results are displayed) Labs Reviewed - No data to display RADIOLOGY ED MD interpretation: KUB showing mild to moderate amount of stool within the ascending colon - All radiology independently interpreted and agree with radiology assessment Official radiology report(s): DG Abdomen 1 View Result Date: 06/05/2024 CLINICAL DATA:  Constipation. EXAM: ABDOMEN - 1 VIEW COMPARISON:  None Available. FINDINGS: The bowel gas pattern is normal. A mild-to-moderate amount of stool is seen within the ascending colon. No radio-opaque calculi or other significant radiographic abnormality are seen. IMPRESSION: Mild-to-moderate amount of stool within the ascending colon. Electronically Signed   By: Suzen Dials M.D.   On: 06/05/2024 23:06   PROCEDURES: Critical Care performed: No Procedures MEDICATIONS ORDERED IN ED: Medications - No data to display IMPRESSION / MDM / ASSESSMENT AND PLAN / ED COURSE  I reviewed the triage vital signs and the nursing notes.                             The patient is on the cardiac monitor to evaluate for evidence of arrhythmia and/or significant heart rate changes. Patient's presentation is most consistent with  acute presentation with potential threat to life or bodily function. Patient's history and exam most consistent with constipation as an etiology for their pain.  Patient's symptoms not typical for other emergent causes of abdominal pain such as, but not limited to, appendicitis, abdominal aortic aneurysm, pancreatitis, SBO, mesenteric ischemia, serious intra-abdominal bacterial illness.  Patient without red flags concerning for cancer as a constipation etiology.  Rx: Miralax  Disposition:  Patient will be discharged with strict return precautions and follow up with primary MD within 24-48 hours for further evaluation. Patient understands that this still may have an early presentation of an  emergent medical condition such as appendicitis that will require a recheck.   FINAL CLINICAL IMPRESSION(S) / ED DIAGNOSES   Final diagnoses:  Constipation, unspecified constipation type   Rx / DC Orders   ED Discharge Orders     None      Note:  This document was prepared using Dragon voice recognition software and may include unintentional dictation errors.   Aydden Cumpian K, MD 06/06/24 0130

## 2024-06-12 ENCOUNTER — Ambulatory Visit
Admission: RE | Admit: 2024-06-12 | Discharge: 2024-06-12 | Disposition: A | Discharge status: Home / Self Care | Source: Ambulatory Visit | Attending: Gerontology | Admitting: Gerontology

## 2024-06-12 DIAGNOSIS — R630 Anorexia: Secondary | ICD-10-CM | POA: Insufficient documentation

## 2024-06-12 DIAGNOSIS — R634 Abnormal weight loss: Secondary | ICD-10-CM | POA: Insufficient documentation

## 2024-06-12 DIAGNOSIS — R935 Abnormal findings on diagnostic imaging of other abdominal regions, including retroperitoneum: Secondary | ICD-10-CM | POA: Insufficient documentation

## 2024-06-12 DIAGNOSIS — R635 Abnormal weight gain: Secondary | ICD-10-CM | POA: Insufficient documentation

## 2024-06-12 MED ORDER — IOHEXOL 300 MG/ML  SOLN
100.0000 mL | Freq: Once | INTRAMUSCULAR | Status: AC | PRN
  Administered 2024-06-12: 100 mL via INTRAVENOUS

## 2024-07-18 DIAGNOSIS — Z008 Encounter for other general examination: Secondary | ICD-10-CM | POA: Diagnosis not present

## 2024-07-18 DIAGNOSIS — E119 Type 2 diabetes mellitus without complications: Secondary | ICD-10-CM | POA: Diagnosis not present

## 2024-08-19 DIAGNOSIS — Z1331 Encounter for screening for depression: Secondary | ICD-10-CM | POA: Diagnosis not present

## 2024-08-19 DIAGNOSIS — R7989 Other specified abnormal findings of blood chemistry: Secondary | ICD-10-CM | POA: Diagnosis not present

## 2024-08-19 DIAGNOSIS — E0590 Thyrotoxicosis, unspecified without thyrotoxic crisis or storm: Secondary | ICD-10-CM | POA: Diagnosis not present

## 2024-08-28 DIAGNOSIS — I152 Hypertension secondary to endocrine disorders: Secondary | ICD-10-CM | POA: Diagnosis not present

## 2024-08-28 DIAGNOSIS — E1151 Type 2 diabetes mellitus with diabetic peripheral angiopathy without gangrene: Secondary | ICD-10-CM | POA: Diagnosis not present

## 2024-08-28 DIAGNOSIS — D509 Iron deficiency anemia, unspecified: Secondary | ICD-10-CM | POA: Diagnosis not present

## 2024-08-28 DIAGNOSIS — E1159 Type 2 diabetes mellitus with other circulatory complications: Secondary | ICD-10-CM | POA: Diagnosis not present
